# Patient Record
Sex: Male | Born: 1965 | Race: Asian | Hispanic: No | Marital: Married | Smoking: Former smoker
Health system: Southern US, Community
[De-identification: ages and names within clinical notes are randomized; demographics above are authoritative.]

## PROBLEM LIST (undated history)

## (undated) ENCOUNTER — Emergency Department (HOSPITAL_COMMUNITY): Disposition: A | Discharge status: Home / Self Care | Payer: Self-pay

## (undated) DIAGNOSIS — T8859XA Other complications of anesthesia, initial encounter: Secondary | ICD-10-CM

## (undated) DIAGNOSIS — Z87442 Personal history of urinary calculi: Secondary | ICD-10-CM

## (undated) DIAGNOSIS — N201 Calculus of ureter: Secondary | ICD-10-CM

## (undated) DIAGNOSIS — T4145XA Adverse effect of unspecified anesthetic, initial encounter: Secondary | ICD-10-CM

## (undated) DIAGNOSIS — N189 Chronic kidney disease, unspecified: Secondary | ICD-10-CM

## (undated) HISTORY — PX: CATARACT EXTRACTION W/ INTRAOCULAR LENS IMPLANT: SHX1309

---

## 2002-03-07 ENCOUNTER — Emergency Department (HOSPITAL_COMMUNITY): Admission: EM | Admit: 2002-03-07 | Discharge: 2002-03-07 | Payer: Self-pay | Admitting: Emergency Medicine

## 2009-02-23 ENCOUNTER — Emergency Department: Payer: Self-pay | Admitting: Emergency Medicine

## 2009-02-28 HISTORY — PX: OTHER SURGICAL HISTORY: SHX169

## 2009-06-24 ENCOUNTER — Ambulatory Visit: Payer: Self-pay | Admitting: Ophthalmology

## 2012-06-19 ENCOUNTER — Ambulatory Visit: Payer: Self-pay | Admitting: Urology

## 2012-06-20 ENCOUNTER — Other Ambulatory Visit: Payer: Self-pay | Admitting: Urology

## 2012-06-21 ENCOUNTER — Ambulatory Visit: Payer: Self-pay | Admitting: Urology

## 2012-06-25 ENCOUNTER — Encounter (HOSPITAL_COMMUNITY): Payer: Self-pay | Admitting: Pharmacy Technician

## 2012-06-27 ENCOUNTER — Encounter (HOSPITAL_COMMUNITY): Payer: Self-pay | Admitting: *Deleted

## 2012-07-05 ENCOUNTER — Encounter (HOSPITAL_COMMUNITY): Payer: Self-pay | Admitting: *Deleted

## 2012-07-05 ENCOUNTER — Ambulatory Visit (HOSPITAL_COMMUNITY): Payer: Managed Care, Other (non HMO)

## 2012-07-05 ENCOUNTER — Ambulatory Visit (HOSPITAL_COMMUNITY)
Admission: RE | Admit: 2012-07-05 | Discharge: 2012-07-05 | Disposition: A | Discharge status: Home / Self Care | Payer: Managed Care, Other (non HMO) | Source: Ambulatory Visit | Attending: Urology | Admitting: Urology

## 2012-07-05 ENCOUNTER — Encounter (HOSPITAL_COMMUNITY)
Admission: RE | Disposition: A | Discharge status: Home / Self Care | Payer: Self-pay | Source: Ambulatory Visit | Attending: Urology

## 2012-07-05 ENCOUNTER — Emergency Department: Payer: Self-pay | Admitting: Emergency Medicine

## 2012-07-05 DIAGNOSIS — R109 Unspecified abdominal pain: Secondary | ICD-10-CM | POA: Insufficient documentation

## 2012-07-05 DIAGNOSIS — R112 Nausea with vomiting, unspecified: Secondary | ICD-10-CM | POA: Insufficient documentation

## 2012-07-05 DIAGNOSIS — N1330 Unspecified hydronephrosis: Secondary | ICD-10-CM | POA: Insufficient documentation

## 2012-07-05 DIAGNOSIS — N201 Calculus of ureter: Secondary | ICD-10-CM | POA: Insufficient documentation

## 2012-07-05 DIAGNOSIS — N200 Calculus of kidney: Secondary | ICD-10-CM | POA: Insufficient documentation

## 2012-07-05 HISTORY — PX: EXTRACORPOREAL SHOCK WAVE LITHOTRIPSY: SHX1557

## 2012-07-05 HISTORY — DX: Chronic kidney disease, unspecified: N18.9

## 2012-07-05 LAB — COMPREHENSIVE METABOLIC PANEL
Alkaline Phosphatase: 78 U/L (ref 50–136)
Calcium, Total: 9 mg/dL (ref 8.5–10.1)
Chloride: 104 mmol/L (ref 98–107)
EGFR (Non-African Amer.): >60
Osmolality: 279 (ref 275–301)
Potassium: 3.8 mmol/L (ref 3.5–5.1)
Total Protein: 8.1 g/dL (ref 6.4–8.2)

## 2012-07-05 LAB — CBC WITH DIFFERENTIAL/PLATELET
Basophil #: 0 10*3/uL (ref 0.0–0.1)
Eosinophil #: 0.1 10*3/uL (ref 0.0–0.7)
Eosinophil %: 0.9 %
HCT: 43.3 % (ref 40.0–52.0)
Lymphocyte #: 2 10*3/uL (ref 1.0–3.6)
Lymphocyte %: 14.9 %
MCHC: 34.4 g/dL (ref 32.0–36.0)
MCV: 91 fL (ref 80–100)
Monocyte %: 7.1 %
Neutrophil #: 10 10*3/uL — ABNORMAL HIGH (ref 1.4–6.5)
Platelet: 256 10*3/uL (ref 150–440)
RBC: 4.77 10*6/uL (ref 4.40–5.90)
WBC: 13.1 10*3/uL — ABNORMAL HIGH (ref 3.8–10.6)

## 2012-07-05 SURGERY — LITHOTRIPSY, ESWL
Anesthesia: LOCAL | Laterality: Right

## 2012-07-05 MED ORDER — SODIUM CHLORIDE 0.9 % IV SOLN
INTRAVENOUS | Status: DC
  Administered 2012-07-05: 09:00:00 via INTRAVENOUS

## 2012-07-05 MED ORDER — CIPROFLOXACIN HCL 500 MG PO TABS: 500.0000 mg | ORAL_TABLET | Freq: Two times a day (BID) | ORAL | Status: DC

## 2012-07-05 MED ORDER — OXYCODONE-ACETAMINOPHEN 5-325 MG PO TABS
1.0000 | ORAL_TABLET | ORAL | Status: DC | PRN
  Administered 2012-07-05: 1 via ORAL
  Filled 2012-07-05: qty 1

## 2012-07-05 MED ORDER — DIAZEPAM 5 MG PO TABS
10.0000 mg | ORAL_TABLET | ORAL | Status: AC
  Administered 2012-07-05: 10 mg via ORAL
  Filled 2012-07-05: qty 2

## 2012-07-05 MED ORDER — DIPHENHYDRAMINE HCL 25 MG PO CAPS
25.0000 mg | ORAL_CAPSULE | ORAL | Status: AC
  Administered 2012-07-05: 25 mg via ORAL
  Filled 2012-07-05: qty 1

## 2012-07-05 MED ORDER — CIPROFLOXACIN HCL 500 MG PO TABS
500.0000 mg | ORAL_TABLET | ORAL | Status: AC
  Administered 2012-07-05: 500 mg via ORAL
  Filled 2012-07-05: qty 1

## 2012-07-05 MED ORDER — OXYCODONE-ACETAMINOPHEN 5-325 MG PO TABS: 1.0000 | ORAL_TABLET | ORAL | Status: DC | PRN

## 2012-07-05 NOTE — H&P (Signed)
  H&P  History of Present Illness: the patient presented with intermittent right flank pain April 2014. KUB showed a calcification of 1.3 cm in the region of the right UPJ. Renal ultrasound was equivocal and a CT scan confirmed stone in the right renal pelvis. Looking at today's KUB the stone appears stable in the right renal pelvis.  The patient has been well without dysuria or fever.  Past Medical History  Diagnosis Date  . Chronic kidney disease     hx of right kidney stone   Past Surgical History  Procedure Laterality Date  . Eye surgery      3-4 years ago    Home Medications:  Prescriptions prior to admission  Medication Sig Dispense Refill  . oxyCODONE-acetaminophen (PERCOCET/ROXICET) 5-325 MG per tablet Take 1 tablet by mouth every 4 (four) hours as needed for pain.      . EPINEPHrine (EPIPEN 2-PAK) 0.3 mg/0.3 mL DEVI Inject 0.3 mg into the muscle once. For bee stings       Allergies:  Allergies  Allergen Reactions  . Bee Venom Anaphylaxis    History reviewed. No pertinent family history. Social History:  reports that he has quit smoking. He does not have any smokeless tobacco history on file. He reports that  drinks alcohol. He reports that he uses illicit drugs (Marijuana).  ROS: A complete review of systems was performed.  All systems are negative except for pertinent findings as noted. @ROS@   Physical Exam:  Vital signs in last 24 hours: Temp:  [98 F (36.7 C)] 98 F (36.7 C) (05/08 0815) Pulse Rate:  [69] 69 (05/08 0815) Resp:  [18] 18 (05/08 0815) BP: (130)/(78) 130/78 mmHg (05/08 0815) SpO2:  [99 %] 99 % (05/08 0815) Weight:  [107.729 kg (237 lb 8 oz)] 107.729 kg (237 lb 8 oz) (05/08 0815) General:  Alert and oriented, No acute distress HEENT: Normocephalic, atraumatic Neck: No JVD or lymphadenopathy Cardiovascular: Regular rate and rhythm Lungs: Regular rate and effort Abdomen: Soft, nontender, nondistended, no abdominal masses Back: No CVA  tenderness Extremities: No edema Neurologic: Grossly intact  Laboratory Data:  No results found for this or any previous visit (from the past 24 hour(s)). No results found for this or any previous visit (from the past 240 hour(s)). Creatinine: No results found for this basename: CREATININE,  in the last 168 hours  Impression/Assessment:   right renal stone with intermittent renal colic  Plan:  I discussed with the patient the nature, potential benefits, risks and alternatives to right extracorporeal shockwave lithotripsy, including side effects of the proposed treatment, the likelihood of the patient achieving the goals of the procedure, and any potential problems that might occur during the procedure or recuperation. All questions answered. Patient elects to proceed.  [name] [name] 07/05/2012, 10:06 AM  

## 2012-07-05 NOTE — Brief Op Note (Signed)
07/05/2012  11:17 AM  PATIENT:  [name] [name]  47 y.o. male  PRE-OPERATIVE DIAGNOSIS:  RIGHT NEPHROLITHASIS   POST-OPERATIVE DIAGNOSIS:  * No post-op diagnosis entered *  PROCEDURE:  Procedure(s): EXTRACORPOREAL SHOCK WAVE LITHOTRIPSY (ESWL)RIGHT   (Right)  SURGEON:  Surgeon(s) and Role:    * Antony Haste, MD - Primary   Findings: treatment tolerated poorly - pt with pain at 0.8, movement, coughing, required recoupling.   PATIENT DISPOSITION:  PACU - hemodynamically stable.   Delay start of Pharmacological VTE agent (>24hrs) due to surgical blood loss or risk of bleeding: yes

## 2012-07-06 ENCOUNTER — Encounter (HOSPITAL_COMMUNITY)
Admission: AD | Disposition: A | Discharge status: Home / Self Care | Payer: Self-pay | Source: Other Acute Inpatient Hospital | Attending: Urology

## 2012-07-06 ENCOUNTER — Encounter (HOSPITAL_COMMUNITY): Payer: Self-pay | Admitting: Certified Registered"

## 2012-07-06 ENCOUNTER — Observation Stay (HOSPITAL_COMMUNITY)
Admission: AD | Admit: 2012-07-06 | Discharge: 2012-07-06 | Disposition: A | Discharge status: Home / Self Care | Payer: Managed Care, Other (non HMO) | Source: Other Acute Inpatient Hospital | Attending: Urology | Admitting: Urology

## 2012-07-06 ENCOUNTER — Observation Stay (HOSPITAL_COMMUNITY): Payer: Managed Care, Other (non HMO) | Admitting: Certified Registered"

## 2012-07-06 HISTORY — PX: CYSTOSCOPY W/ URETERAL STENT PLACEMENT: SHX1429

## 2012-07-06 LAB — URINALYSIS, COMPLETE
Bilirubin,UR: NEGATIVE
Ketone: NEGATIVE
Nitrite: NEGATIVE
Protein: 30
RBC,UR: 175 /HPF (ref 0–5)
WBC UR: 1 /HPF (ref 0–5)

## 2012-07-06 SURGERY — CYSTOSCOPY, WITH RETROGRADE PYELOGRAM AND URETERAL STENT INSERTION
Anesthesia: General | Laterality: Right

## 2012-07-06 SURGERY — CYSTOSCOPY, WITH RETROGRADE PYELOGRAM AND URETERAL STENT INSERTION
Anesthesia: General | Site: Ureter | Laterality: Right | Wound class: Clean Contaminated

## 2012-07-06 MED ORDER — ONDANSETRON HCL 4 MG/2ML IJ SOLN: 4.0000 mg | INTRAMUSCULAR | Status: DC | PRN

## 2012-07-06 MED ORDER — IOHEXOL 300 MG/ML  SOLN
INTRAMUSCULAR | Status: DC | PRN
  Administered 2012-07-06: 50 mL

## 2012-07-06 MED ORDER — SODIUM CHLORIDE 0.9 % IR SOLN
Status: DC | PRN
  Administered 2012-07-06: 1000 mL

## 2012-07-06 MED ORDER — ONDANSETRON HCL 4 MG/2ML IJ SOLN
INTRAMUSCULAR | Status: DC | PRN
  Administered 2012-07-06: 4 mg via INTRAVENOUS

## 2012-07-06 MED ORDER — HYDROMORPHONE HCL PF 1 MG/ML IJ SOLN
0.5000 mg | INTRAMUSCULAR | Status: DC | PRN
  Administered 2012-07-06 (×2): 1 mg via INTRAVENOUS
  Filled 2012-07-06 (×2): qty 1

## 2012-07-06 MED ORDER — MEPERIDINE HCL 50 MG/ML IJ SOLN: 6.2500 mg | INTRAMUSCULAR | Status: DC | PRN

## 2012-07-06 MED ORDER — PROMETHAZINE HCL 25 MG/ML IJ SOLN: 6.2500 mg | INTRAMUSCULAR | Status: DC | PRN

## 2012-07-06 MED ORDER — OXYCODONE HCL 5 MG PO TABS: 5.0000 mg | ORAL_TABLET | Freq: Once | ORAL | Status: DC | PRN

## 2012-07-06 MED ORDER — OXYCODONE-ACETAMINOPHEN 5-325 MG PO TABS: 1.0000 | ORAL_TABLET | ORAL | Status: DC | PRN

## 2012-07-06 MED ORDER — STERILE WATER FOR IRRIGATION IR SOLN
Status: DC | PRN
  Administered 2012-07-06: 1000 mL

## 2012-07-06 MED ORDER — SUCCINYLCHOLINE CHLORIDE 20 MG/ML IJ SOLN
INTRAMUSCULAR | Status: DC | PRN
  Administered 2012-07-06: 100 mg via INTRAVENOUS

## 2012-07-06 MED ORDER — ACETAMINOPHEN 10 MG/ML IV SOLN
INTRAVENOUS | Status: DC | PRN
  Administered 2012-07-06: 1000 mg via INTRAVENOUS

## 2012-07-06 MED ORDER — CEFAZOLIN SODIUM-DEXTROSE 2-3 GM-% IV SOLR
INTRAVENOUS | Status: AC
  Filled 2012-07-06: qty 50

## 2012-07-06 MED ORDER — HYDROMORPHONE HCL PF 1 MG/ML IJ SOLN: 0.2500 mg | INTRAMUSCULAR | Status: DC | PRN

## 2012-07-06 MED ORDER — CEFAZOLIN SODIUM-DEXTROSE 2-3 GM-% IV SOLR
INTRAVENOUS | Status: DC | PRN
  Administered 2012-07-06: 2 g via INTRAVENOUS

## 2012-07-06 MED ORDER — IOHEXOL 300 MG/ML  SOLN
INTRAMUSCULAR | Status: AC
  Filled 2012-07-06: qty 1

## 2012-07-06 MED ORDER — DEXTROSE 5 % IV SOLN
1.0000 g | INTRAVENOUS | Status: DC
  Filled 2012-07-06 (×2): qty 10

## 2012-07-06 MED ORDER — SODIUM CHLORIDE 0.9 % IV SOLN
INTRAVENOUS | Status: DC
  Administered 2012-07-06: 02:00:00 via INTRAVENOUS

## 2012-07-06 MED ORDER — DOCUSATE SODIUM 100 MG PO CAPS
100.0000 mg | ORAL_CAPSULE | Freq: Two times a day (BID) | ORAL | Status: DC
  Administered 2012-07-06: 100 mg via ORAL
  Filled 2012-07-06 (×2): qty 1

## 2012-07-06 MED ORDER — ACETAMINOPHEN 10 MG/ML IV SOLN
INTRAVENOUS | Status: AC
  Filled 2012-07-06: qty 100

## 2012-07-06 MED ORDER — CIPROFLOXACIN HCL 500 MG PO TABS
500.0000 mg | ORAL_TABLET | Freq: Two times a day (BID) | ORAL | Status: DC
  Administered 2012-07-06: 500 mg via ORAL
  Filled 2012-07-06 (×3): qty 1

## 2012-07-06 MED ORDER — LIDOCAINE HCL (CARDIAC) 20 MG/ML IV SOLN
INTRAVENOUS | Status: DC | PRN
  Administered 2012-07-06: 100 mg via INTRAVENOUS

## 2012-07-06 MED ORDER — OXYCODONE HCL 5 MG/5ML PO SOLN: 5.0000 mg | Freq: Once | ORAL | Status: DC | PRN

## 2012-07-06 MED ORDER — ACETAMINOPHEN 10 MG/ML IV SOLN
1000.0000 mg | Freq: Once | INTRAVENOUS | Status: DC | PRN
  Filled 2012-07-06: qty 100

## 2012-07-06 MED ORDER — PROPOFOL 10 MG/ML IV BOLUS
INTRAVENOUS | Status: DC | PRN
  Administered 2012-07-06: 200 mg via INTRAVENOUS

## 2012-07-06 MED ORDER — SODIUM CHLORIDE 0.9 % IV SOLN: INTRAVENOUS | Status: DC

## 2012-07-06 SURGICAL SUPPLY — 16 items
ADAPTER CATH URET PLST 4-6FR (CATHETERS) ×3 IMPLANT
ADPR CATH URET STRL DISP 4-6FR (CATHETERS) ×2
BAG URO CATCHER STRL LF (DRAPE) ×3 IMPLANT
CATH URET 5FR 28IN CONE TIP (BALLOONS)
CATH URET 5FR 70CM CONE TIP (BALLOONS) ×1 IMPLANT
CLOTH BEACON ORANGE TIMEOUT ST (SAFETY) ×3 IMPLANT
DRAPE CAMERA CLOSED 9X96 (DRAPES) ×3 IMPLANT
GLOVE BIOGEL M 8.0 STRL (GLOVE) ×3 IMPLANT
GOWN PREVENTION PLUS XLARGE (GOWN DISPOSABLE) ×3 IMPLANT
GOWN STRL REIN XL XLG (GOWN DISPOSABLE) ×3 IMPLANT
GUIDEWIRE STR DUAL SENSOR (WIRE) ×3 IMPLANT
MANIFOLD NEPTUNE II (INSTRUMENTS) ×3 IMPLANT
PACK CYSTO (CUSTOM PROCEDURE TRAY) ×3 IMPLANT
STENT CONTOUR 6FRX28X.038 (STENTS) ×2 IMPLANT
TUBING CONNECTING 10 (TUBING) ×3 IMPLANT
WIRE COONS/BENSON .038X145CM (WIRE) ×1 IMPLANT

## 2012-07-06 NOTE — H&P (Addendum)
  H&P   History of Present Illness:   Pt s/p right ESWL for 14 mm right UPJ stone yesterday. Since then he has had severe right flank pain, nausea and emesis.   CT A/P at [facility] - stone fragmented weel - several fragements have passed to right UVJ with proximal obstruction.   Past Medical History  Diagnosis Date  . Chronic kidney disease     hx of right kidney stone   Past Surgical History  Procedure Laterality Date  . Eye surgery      3-4 years ago    Home Medications:  Prescriptions prior to admission  Medication Sig Dispense Refill  . ciprofloxacin (CIPRO) 500 MG tablet Take 1 tablet (500 mg total) by mouth 2 (two) times daily.  6 tablet  0  . oxyCODONE-acetaminophen (PERCOCET/ROXICET) 5-325 MG per tablet Take 1 tablet by mouth every 4 (four) hours as needed for pain.      Marland Kitchen oxyCODONE-acetaminophen (ROXICET) 5-325 MG per tablet Take 1 tablet by mouth every 4 (four) hours as needed for pain.  30 tablet  0  . EPINEPHrine (EPIPEN 2-PAK) 0.3 mg/0.3 mL DEVI Inject 0.3 mg into the muscle once. For bee stings       Allergies:  Allergies  Allergen Reactions  . Bee Venom Anaphylaxis    No family history on file. Social History:  reports that he has quit smoking. He does not have any smokeless tobacco history on file. He reports that  drinks alcohol. He reports that he uses illicit drugs (Marijuana).  ROS: A complete review of systems was performed.  All systems are negative except for pertinent findings as noted. @ROS @   Physical Exam:  Vital signs in last 24 hours: Temp:  [97.9 F (36.6 C)-99 F (37.2 C)] 99 F (37.2 C) (05/09 0053) Pulse Rate:  [67-77] 77 (05/09 0053) Resp:  [18-20] 20 (05/09 0053) BP: (123-143)/(76-85) 143/76 mmHg (05/09 0053) SpO2:  [94 %-99 %] 94 % (05/09 0053) Weight:  [107.729 kg (237 lb 8 oz)] 107.729 kg (237 lb 8 oz) (05/08 0815) General:  Alert and oriented, No acute distress HEENT: Normocephalic, atraumatic Neck: No JVD or  lymphadenopathy Cardiovascular: Regular rate and rhythm Lungs: Regular rate and effort Abdomen: Soft, nontender, nondistended, no abdominal masses GU: foley in place with yellow urine in bag. Back: No CVA tenderness Extremities: No edema Neurologic: Grossly intact  Laboratory Data:  From [facility]: Wbc 13.1 Hgb 14.9 Hct 43 plt 256 Na  K Bun 16 Cr 1.3  Impression/Assessment:  Right renal stones Right ureteral stones Right hydronephrosis Flank pain Nausea/emesis  Plan:  I discussed with the patient the nature, potential benefits, risks and alternatives to cystoscopy, right RGP and right ureteral stent placement, including side effects of the proposed treatment, the likelihood of the patient achieving the goals of the procedure, and any potential problems that might occur during the procedure or recuperation. We discussed I would prefer to stent him tonight and get him stabilized with f/u in office with KUB to consider stent removal vs. Second look ureteroscopy in a staged fashion. I'm a bit hesitant to do ureteroscopy tonight with T 99 and WBC 13. All questions answered. Patient elects to proceed with cystoscopy, stent placement.   Antony Haste 07/06/2012, 1:44 AM   I should add I reviewed the CT images, [facility] notes and labs myself.

## 2012-07-06 NOTE — Transfer of Care (Signed)
Immediate Anesthesia Transfer of Care Note  Patient: [name] [name]  Procedure(s) Performed: Procedure(s): CYSTOSCOPY WITH RETROGRADE PYELOGRAM/URETERAL STENT PLACEMENT right (Right)  Patient Location: PACU  Anesthesia Type:General  Level of Consciousness: awake, alert  and oriented  Airway & Oxygen Therapy: Patient Spontanous Breathing and Patient connected to face mask oxygen  Post-op Assessment: Report given to PACU RN and Post -op Vital signs reviewed and stable  Post vital signs: Reviewed and stable  Complications: No apparent anesthesia complications

## 2012-07-06 NOTE — Progress Notes (Addendum)
Report called from [facility] ED Nurse Gaylyn Rong: regarding transporting patient to [facility] on the service or Doctor Eskridge.  Patient transported by carelink. Vitals signs taken, patient placed on monitor and central telemetry tech (Darlinda) called and confirmed medical record information and strip provided. Call back from Doctor Eskridge orders given patient to remain NPO. IV fluids started per order. Patient with complaint of pain on R flank area. Foley noted with blood tinged urine. Will continue to monitor per Doctor's orders and unit protocol.  Consent obtained.  Patient transported to the OR on telemetry. Central monitoring called (Darlinda) to inform of transfer. Charge Nurse Maxine Glenn made aware, patient may possibly return to rm [id number].   Patient's spouse name and # [name] [name] [phone]  Update: patient has voided this morning, clear amber urine (see doc flow sheets ). Urinal  on bathroom shelf. Ongoing nurse aware and  Witnessed.

## 2012-07-06 NOTE — OR Nursing (Signed)
Corrected doctor arrival and procedure start times, Jannett Celestine, RN

## 2012-07-06 NOTE — Progress Notes (Signed)
Discharge to home, wife at bedside, d/c instructions and follow up appoinmentt done and was given to the patient. PIV removed no s/s of  Infiltration or swelling noted upon discharged.No complaints of any pain or discomfort upon discharged.

## 2012-07-06 NOTE — Op Note (Signed)
Preoperative diagnosis: Right ureteral stone, right renal stones, right hydronephrosis, emesis, flank pain Postop diagnosis: Same  Procedure: Exam under anesthesia Cystoscopy Right retrograde pyelogram Right ureteral stent placement  Surgeon: Ashlon Lottman Type of anesthesia: Gen.  Findings: On exam under anesthesia the penis was without mass or lesion. Testicles were descended and palpably normal. The prostate was mildly enlarged but palpably normal with no hard area or nodule.  Cystoscopy-the urethra appeared normal, the prostatic urethra appeared normal, the trigone and ureteral orifice these were in their normal orthotopic position, proximal to the right ureteral orifice there was a bulge in the mucosa likely from stones underneath, the bladder mucosa was normal without mass or lesion. There were multiple stone fragments in the bladder.  Right retrograde pyelogram  the right collecting system appeared mildly dilated with multiple filling defects in the renal pelvis consistent with stone.  Description of procedure: After consent was obtained patient brought to the operating room. After adequate anesthesia he was placed in lithotomy position. A timeout was performed to confirm the patient and procedure. An exam under anesthesia was performed. The patient was prepped and draped in the usual fashion. A cystoscope was passed per urethra and I attempted to advance a 6 [place] open-ended catheter into the right ureteral orifice for retrograde the ureteral orifice was very narrowed. Next I attempted to advance a sensor wire into the right ureter. The wire would go a few millimeters through the ureteral orifice and then bow back into the bladder. I had the brace the sensor wire with a 6 [place] open-ended catheter and very carefully advanced the sensor wire. It took several tries to get proximal to the stone fragments with the wire. The wire was then advanced up into the right collecting system and the  open-ended catheter was advanced into the right collecting system. The wire was removed and retrograde injection of contrast was performed which confirm proper placement of the wire and the collecting system. The wire was re\re coiled in the open-ended catheter removed. Over the wire 6 x 28 cm stent was advanced and the wire removed. Good coil seen in the bladder and a good coil in the kidney. The bladder was drained and the scope removed. The patient was awakened and brought to the recovery room in stable condition.  Complications: None  Blood loss: None  Drains: 6 x 28 cm ureteral stent  Disposition: Patient stable to PACU

## 2012-07-06 NOTE — Anesthesia Preprocedure Evaluation (Addendum)
Anesthesia Evaluation  Patient identified by MRN, date of birth, ID band Patient awake    Reviewed: Allergy & Precautions, H&P , NPO status , Patient's Chart, lab work & pertinent test results  Airway Mallampati: II TM Distance: >3 FB Neck ROM: Full    Dental  (+) Dental Advisory Given and Teeth Intact   Pulmonary neg pulmonary ROS,  breath sounds clear to auscultation        Cardiovascular negative cardio ROS  Rhythm:Regular Rate:Normal     Neuro/Psych negative neurological ROS  negative psych ROS   GI/Hepatic negative GI ROS, Neg liver ROS,   Endo/Other  negative endocrine ROS  Renal/GU negative Renal ROS     Musculoskeletal negative musculoskeletal ROS (+)   Abdominal   Peds  Hematology negative hematology ROS (+)   Anesthesia Other Findings   Reproductive/Obstetrics                         Anesthesia Physical Anesthesia Plan  ASA: II  Anesthesia Plan: General   Post-op Pain Management:    Induction: Intravenous and Rapid sequence  Airway Management Planned: Oral ETT  Additional Equipment:   Intra-op Plan:   Post-operative Plan: Extubation in OR  Informed Consent: I have reviewed the patients History and Physical, chart, labs and discussed the procedure including the risks, benefits and alternatives for the proposed anesthesia with the patient or authorized representative who has indicated his/her understanding and acceptance.   Dental advisory given  Plan Discussed with: CRNA  Anesthesia Plan Comments:         Anesthesia Quick Evaluation

## 2012-07-06 NOTE — Discharge Summary (Addendum)
Physician Discharge Summary  Patient ID: [name] [name] MRN: [id number] DOB/AGE: 09/09/1965 47 y.o.  Admit date: 07/06/2012 Discharge date: 07/06/2012  Admission Diagnoses: Right renal stone Right ureteral stone Emesis Flank pain Hydronephrosis Discharge Diagnoses:  Active Problems:   * No active hospital problems. * Same   Discharged Condition: good  Hospital Course: Patient admitted and underwent cysto, right ureteral stent placement. He has done well. Pain improved, emesis resolved. AFVSS. Tolerating regular diet.  Consults: None  Significant Diagnostic Studies:none  Treatments: surgery:  cystoscopy, right ureteral stent placement  Discharge Exam: Blood pressure 130/77, pulse 80, temperature 98.5 F (36.9 C), temperature source Oral, resp. rate 12, height 6\' 2"  (1.88 m), weight 107.7 kg (237 lb 7 oz), SpO2 98.00%. A&Ox3 NAD Abd - soft, NT, ND. No flank pain.  Disposition: 01-Home or Self Care     Medication List    TAKE these medications       ciprofloxacin 500 MG tablet  Commonly known as:  CIPRO  Take 1 tablet (500 mg total) by mouth 2 (two) times daily.     EPIPEN 2-PAK 0.3 mg/0.3 mL Devi  Generic drug:  EPINEPHrine  Inject 0.3 mg into the muscle once. For bee stings     oxyCODONE-acetaminophen 5-325 MG per tablet  Commonly known as:  ROXICET  Take 1 tablet by mouth every 4 (four) hours as needed for pain.           Follow-up Information   Follow up with Mena Goes Lowella Petties, MD In 2 weeks.   Contact information:   [address] [place] [address] [phone]       Signed: Antony Haste 07/06/2012, 11:48 AM

## 2012-07-09 ENCOUNTER — Encounter (HOSPITAL_COMMUNITY): Payer: Self-pay | Admitting: Urology

## 2012-07-09 NOTE — Anesthesia Postprocedure Evaluation (Signed)
Anesthesia Post Note  Patient: [name] [name]  Procedure(s) Performed: Procedure(s) (LRB): CYSTOSCOPY WITH RETROGRADE PYELOGRAM/URETERAL STENT PLACEMENT right (Right)  Anesthesia type: General  Patient location: PACU  Post pain: Pain level controlled  Post assessment: Post-op Vital signs reviewed  Last Vitals: BP 130/77  Pulse 80  Temp(Src) 36.9 C (Oral)  Resp 12  Ht 6\' 2"  (1.88 m)  Wt 237 lb 7 oz (107.7 kg)  BMI 30.47 kg/m2  SpO2 98%  Post vital signs: Reviewed  Level of consciousness: sedated  Complications: No apparent anesthesia complications

## 2012-07-17 ENCOUNTER — Ambulatory Visit: Payer: Self-pay | Admitting: Urology

## 2012-07-20 ENCOUNTER — Other Ambulatory Visit: Payer: Self-pay | Admitting: Urology

## 2012-07-20 ENCOUNTER — Encounter (HOSPITAL_BASED_OUTPATIENT_CLINIC_OR_DEPARTMENT_OTHER): Payer: Self-pay | Admitting: *Deleted

## 2012-07-20 NOTE — Progress Notes (Signed)
NPO AFTER MN. ARRIVES AT 0845. NEEDS HG. MAY TAKE OXYCODONE IF NEEDED W/ SIP OF WATER.

## 2012-07-24 ENCOUNTER — Encounter (HOSPITAL_BASED_OUTPATIENT_CLINIC_OR_DEPARTMENT_OTHER): Payer: Self-pay | Admitting: *Deleted

## 2012-07-24 ENCOUNTER — Encounter (HOSPITAL_BASED_OUTPATIENT_CLINIC_OR_DEPARTMENT_OTHER)
Admission: RE | Disposition: A | Discharge status: Home / Self Care | Payer: Self-pay | Source: Ambulatory Visit | Attending: Urology

## 2012-07-24 ENCOUNTER — Ambulatory Visit (HOSPITAL_BASED_OUTPATIENT_CLINIC_OR_DEPARTMENT_OTHER)
Admission: RE | Admit: 2012-07-24 | Discharge: 2012-07-24 | Disposition: A | Discharge status: Home / Self Care | Payer: Managed Care, Other (non HMO) | Source: Ambulatory Visit | Attending: Urology | Admitting: Urology

## 2012-07-24 ENCOUNTER — Ambulatory Visit (HOSPITAL_BASED_OUTPATIENT_CLINIC_OR_DEPARTMENT_OTHER): Payer: Managed Care, Other (non HMO) | Admitting: Anesthesiology

## 2012-07-24 ENCOUNTER — Encounter (HOSPITAL_BASED_OUTPATIENT_CLINIC_OR_DEPARTMENT_OTHER): Payer: Self-pay | Admitting: Anesthesiology

## 2012-07-24 DIAGNOSIS — N201 Calculus of ureter: Secondary | ICD-10-CM | POA: Insufficient documentation

## 2012-07-24 DIAGNOSIS — N200 Calculus of kidney: Secondary | ICD-10-CM | POA: Insufficient documentation

## 2012-07-24 HISTORY — DX: Personal history of urinary calculi: Z87.442

## 2012-07-24 HISTORY — PX: CYSTOSCOPY WITH STENT PLACEMENT: SHX5790

## 2012-07-24 HISTORY — PX: HOLMIUM LASER APPLICATION: SHX5852

## 2012-07-24 HISTORY — PX: CYSTOSCOPY W/ URETERAL STENT REMOVAL: SHX1430

## 2012-07-24 HISTORY — DX: Calculus of ureter: N20.1

## 2012-07-24 SURGERY — CYSTOURETEROSCOPY, WITH STENT INSERTION
Anesthesia: General | Site: Ureter | Laterality: Right

## 2012-07-24 MED ORDER — DEXAMETHASONE SODIUM PHOSPHATE 4 MG/ML IJ SOLN
INTRAMUSCULAR | Status: DC | PRN
  Administered 2012-07-24: 8 mg via INTRAVENOUS

## 2012-07-24 MED ORDER — CEFAZOLIN SODIUM-DEXTROSE 2-3 GM-% IV SOLR
2.0000 g | INTRAVENOUS | Status: AC
  Administered 2012-07-24: 2 g via INTRAVENOUS
  Filled 2012-07-24: qty 50

## 2012-07-24 MED ORDER — LIDOCAINE HCL (CARDIAC) 20 MG/ML IV SOLN
INTRAVENOUS | Status: DC | PRN
  Administered 2012-07-24: 60 mg via INTRAVENOUS

## 2012-07-24 MED ORDER — FENTANYL CITRATE 0.05 MG/ML IJ SOLN
INTRAMUSCULAR | Status: DC | PRN
  Administered 2012-07-24: 100 ug via INTRAVENOUS
  Administered 2012-07-24 (×2): 25 ug via INTRAVENOUS

## 2012-07-24 MED ORDER — LACTATED RINGERS IV SOLN
INTRAVENOUS | Status: DC
  Administered 2012-07-24 (×2): via INTRAVENOUS
  Administered 2012-07-24: 100 mL/h via INTRAVENOUS
  Filled 2012-07-24: qty 1000

## 2012-07-24 MED ORDER — KETOROLAC TROMETHAMINE 30 MG/ML IJ SOLN
30.0000 mg | Freq: Four times a day (QID) | INTRAMUSCULAR | Status: DC | PRN
  Administered 2012-07-24: 30 mg via INTRAVENOUS
  Filled 2012-07-24: qty 1

## 2012-07-24 MED ORDER — MEPERIDINE HCL 25 MG/ML IJ SOLN
6.2500 mg | INTRAMUSCULAR | Status: DC | PRN
  Filled 2012-07-24: qty 1

## 2012-07-24 MED ORDER — SODIUM CHLORIDE 0.9 % IR SOLN
Status: DC | PRN
  Administered 2012-07-24: 6000 mL

## 2012-07-24 MED ORDER — CEFAZOLIN SODIUM 1-5 GM-% IV SOLN
1.0000 g | INTRAVENOUS | Status: DC
  Filled 2012-07-24: qty 50

## 2012-07-24 MED ORDER — MIDAZOLAM HCL 5 MG/5ML IJ SOLN
INTRAMUSCULAR | Status: DC | PRN
  Administered 2012-07-24: 2 mg via INTRAVENOUS

## 2012-07-24 MED ORDER — OXYCODONE-ACETAMINOPHEN 5-325 MG PO TABS
1.0000 | ORAL_TABLET | Freq: Once | ORAL | Status: AC
  Administered 2012-07-24: 1 via ORAL
  Filled 2012-07-24: qty 1

## 2012-07-24 MED ORDER — ESMOLOL HCL 10 MG/ML IV SOLN
INTRAVENOUS | Status: DC | PRN
  Administered 2012-07-24: 30 mg via INTRAVENOUS

## 2012-07-24 MED ORDER — FENTANYL CITRATE 0.05 MG/ML IJ SOLN
25.0000 ug | INTRAMUSCULAR | Status: DC | PRN
  Administered 2012-07-24 (×2): 25 ug via INTRAVENOUS
  Administered 2012-07-24: 50 ug via INTRAVENOUS
  Administered 2012-07-24 (×2): 25 ug via INTRAVENOUS
  Filled 2012-07-24: qty 1

## 2012-07-24 MED ORDER — ONDANSETRON HCL 4 MG/2ML IJ SOLN
INTRAMUSCULAR | Status: DC | PRN
  Administered 2012-07-24: 4 mg via INTRAVENOUS

## 2012-07-24 MED ORDER — LABETALOL HCL 5 MG/ML IV SOLN
5.0000 mg | INTRAVENOUS | Status: DC | PRN
  Administered 2012-07-24 (×4): 5 mg via INTRAVENOUS
  Filled 2012-07-24: qty 4

## 2012-07-24 MED ORDER — LACTATED RINGERS IV SOLN
INTRAVENOUS | Status: DC
  Filled 2012-07-24: qty 1000

## 2012-07-24 MED ORDER — PROPOFOL 10 MG/ML IV BOLUS
INTRAVENOUS | Status: DC | PRN
  Administered 2012-07-24: 200 mg via INTRAVENOUS

## 2012-07-24 MED ORDER — PROMETHAZINE HCL 25 MG/ML IJ SOLN
6.2500 mg | INTRAMUSCULAR | Status: DC | PRN
  Filled 2012-07-24: qty 1

## 2012-07-24 MED ORDER — BELLADONNA ALKALOIDS-OPIUM 16.2-60 MG RE SUPP
RECTAL | Status: DC | PRN
  Administered 2012-07-24: 1 via RECTAL

## 2012-07-24 SURGICAL SUPPLY — 41 items
ADAPTER CATH URET PLST 4-6FR (CATHETERS) IMPLANT
ADPR CATH URET STRL DISP 4-6FR (CATHETERS)
BAG DRAIN URO-CYSTO SKYTR STRL (DRAIN) ×2 IMPLANT
BAG DRN UROCATH (DRAIN) ×1
BASKET LASER NITINOL 1.9FR (BASKET) IMPLANT
BASKET STNLS GEMINI 4WIRE 3FR (BASKET) IMPLANT
BASKET ZERO TIP NITINOL 2.4FR (BASKET) ×1 IMPLANT
BRUSH URET BIOPSY 3F (UROLOGICAL SUPPLIES) IMPLANT
BSKT STON RTRVL 120 1.9FR (BASKET)
BSKT STON RTRVL GEM 120X11 3FR (BASKET)
BSKT STON RTRVL ZERO TP 2.4FR (BASKET) ×1
CANISTER SUCT LVC 12 LTR MEDI- (MISCELLANEOUS) ×1 IMPLANT
CATH INTERMIT  6FR 70CM (CATHETERS) IMPLANT
CATH URET 5FR 28IN CONE TIP (BALLOONS)
CATH URET 5FR 28IN OPEN ENDED (CATHETERS) IMPLANT
CATH URET 5FR 70CM CONE TIP (BALLOONS) IMPLANT
CLOTH BEACON ORANGE TIMEOUT ST (SAFETY) ×2 IMPLANT
DRAPE CAMERA CLOSED 9X96 (DRAPES) IMPLANT
ELECT REM PT RETURN 9FT ADLT (ELECTROSURGICAL)
ELECTRODE REM PT RTRN 9FT ADLT (ELECTROSURGICAL) IMPLANT
GLOVE BIO SURGEON STRL SZ7.5 (GLOVE) ×2 IMPLANT
GLOVE ECLIPSE 6.5 STRL STRAW (GLOVE) ×1 IMPLANT
GLOVE INDICATOR 6.5 STRL GRN (GLOVE) ×1 IMPLANT
GOWN PREVENTION PLUS LG XLONG (DISPOSABLE) ×1 IMPLANT
GOWN STRL REIN XL XLG (GOWN DISPOSABLE) ×1 IMPLANT
GUIDEWIRE 0.038 PTFE COATED (WIRE) IMPLANT
GUIDEWIRE ANG ZIPWIRE 038X150 (WIRE) IMPLANT
GUIDEWIRE STR DUAL SENSOR (WIRE) ×3 IMPLANT
IV NS IRRIG 3000ML ARTHROMATIC (IV SOLUTION) ×4 IMPLANT
KIT BALLIN UROMAX 15FX10 (LABEL) IMPLANT
KIT BALLN UROMAX 15FX4 (MISCELLANEOUS) IMPLANT
KIT BALLN UROMAX 26 75X4 (MISCELLANEOUS)
LASER FIBER DISP (UROLOGICAL SUPPLIES) ×1 IMPLANT
PACK CYSTOSCOPY (CUSTOM PROCEDURE TRAY) ×2 IMPLANT
SET HIGH PRES BAL DIL (LABEL)
SHEATH ACCESS URETERAL 38CM (SHEATH) IMPLANT
SHEATH ACCESS URETERAL 54CM (SHEATH) IMPLANT
SHEATH URET ACCESS 12FR/35CM (UROLOGICAL SUPPLIES) IMPLANT
SHEATH URET ACCESS 12FR/55CM (UROLOGICAL SUPPLIES) IMPLANT
STENT URET 6FRX28 CONTOUR (STENTS) ×1 IMPLANT
SYRINGE IRR TOOMEY STRL 70CC (SYRINGE) IMPLANT

## 2012-07-24 NOTE — Op Note (Signed)
Preoperative diagnosis: Ureteral stones, renal stones - right Postoperative diagnosis: Same  Procedure: Cystoscopy Right ureteroscopy Holmium laser lithotripsy Stone basket extraction Ureteral stent placement  Surgeon: Mena Goes Anesthesia: Gen.  Description of procedure: After consent was obtained patient brought to the operating room. A timeout was performed to confirm the patient and procedure after adequate anesthesia and the usual preparation and draping. A cystoscope was passed per urethra were the right ureteral stent was grasped and removed through the urethral meatus and a sensor wire advanced and cold in the right collecting system. A rigid ureteroscope was advanced adjacent to the wire and several small fragments were noted in the right distal ureter and these were pulled into the bladder and released with the Nitinol basket. A larger fragment was at the junction of the mid to distal ureter and this was grasped with the Nitinol basket and removed intact. Was able to get the ureteroscope as far as the iliacs but would not go any further therefore a second wire was placed under direct visualization and the scope removed. A ureteral access sheath was placed to just above the iliacs. The flexible ureteroscope was then advanced into the kidney and the collecting system inspected. In the renal pelvis and lower pole there were several fragments that were too large to pull out of the basket. Therefore one of the fragments was grasped and placed in the upper pole and this was then broken into 2 pieces with the holmium laser at a setting of 0.5 and 20. 3 other pieces along the renal pelvis and in the lower pole were also broken into 2 pieces. All of these pieces seemed small enough to remove through the scope. Therefore with the Nitinol basket all of these pieces were removed a total of about 10 fragments all 1-2 mm. Careful inspection of the collecting system noted there to be just some small  fragments remaining and a simply washed away. They appeared too small even basket and were  probably about a millimeter or less in size. therefore the access sheath was backed out on the ureteroscope and again the collecting system and entire ureter inspected and noted to be free of any clinically significant stones with the ureter being stone free and without injury or perforation. The wire was backloaded on the cystoscope and a 6 x 28 cm ureteral stent was placed. The wire was removed and a good coil seen in the kidney and the bladder. The bladder was drained and the scope removed leaving the string in place. The patient was awakened taken to recovery in stable condition.   Complications: None   Blood loss: Minimal   Specimens: Stone fragments to patient   Drains: Right 6 x 28 cm ureteral stent

## 2012-07-24 NOTE — Anesthesia Procedure Notes (Signed)
Procedure Name: LMA Insertion Date/Time: 07/24/2012 10:27 AM Performed by: Renella Cunas D Pre-anesthesia Checklist: Patient identified, Emergency Drugs available, Suction available and Patient being monitored Patient Re-evaluated:Patient Re-evaluated prior to inductionOxygen Delivery Method: Circle System Utilized Preoxygenation: Pre-oxygenation with 100% oxygen Intubation Type: IV induction Ventilation: Mask ventilation without difficulty LMA: LMA inserted LMA Size: 4.0 Number of attempts: 1 Airway Equipment and Method: bite block Placement Confirmation: positive ETCO2 Tube secured with: Tape Dental Injury: Teeth and Oropharynx as per pre-operative assessment

## 2012-07-24 NOTE — Transfer of Care (Signed)
Immediate Anesthesia Transfer of Care Note  Patient: [name] [name]  Procedure(s) Performed: Procedure(s) (LRB): CYSTOSCOPY WITH RIGHT URETEROSCOPY (Right) HOLMIUM LASER APPLICATION (Right) CYSTOSCOPY WITH STENT REMOVAL (Right) CYSTOSCOPY WITH STENT PLACEMENT (Right)  Patient Location: PACU  Anesthesia Type: General  Level of Consciousness: awake, oriented, sedated and patient cooperative  Airway & Oxygen Therapy: Patient Spontanous Breathing and Patient connected to face mask oxygen  Post-op Assessment: Report given to PACU RN and Post -op Vital signs reviewed and stable  Post vital signs: Reviewed and stable  Complications: No apparent anesthesia complications

## 2012-07-24 NOTE — Interval H&P Note (Signed)
History and Physical Interval Note:  07/24/2012 10:10 AM  Doran Stabler  has presented today for surgery, with the diagnosis of Right Nephrolithiasis. He is s/p right ESWL followed by cystoscopy ureteral stent late the next morning for pain, emesis and obstructing fragment. At the [facility] office a KUB showed residual fragments in the kidney and he wants to be stone free before removing the stent. He has been well with no fever or dysuria. He has tolerated the stent well with minimal  pain, frequency or urgency. The various methods of treatment have been discussed with the patient and family. After consideration of risks, benefits and other options for treatment, the patient has consented to  Procedure(s): CYSTOSCOPY WITH RIGHT URETEROSCOPY, HOLMIUM LASER AND STENT PLACEMENT (Right) HOLMIUM LASER APPLICATION (Right) as a surgical intervention .  The patient's history has been reviewed, patient examined, no change in status, stable for surgery.  I have reviewed the patient's chart and labs. We discussed side effects of the proposed treatment, the likelihood of the patient achieving the goals of the procedure, and any potential problems that might occur during the procedure or recuperation. All questions answered. Patient elects to proceed.      Antony Haste

## 2012-07-24 NOTE — Anesthesia Preprocedure Evaluation (Signed)
Anesthesia Evaluation  Patient identified by MRN, date of birth, ID band Patient awake    Reviewed: Allergy & Precautions, H&P , NPO status , Patient's Chart, lab work & pertinent test results  Airway Mallampati: II TM Distance: >3 FB Neck ROM: Full    Dental no notable dental hx.    Pulmonary neg pulmonary ROS,  breath sounds clear to auscultation  Pulmonary exam normal       Cardiovascular negative cardio ROS  Rhythm:Regular Rate:Normal     Neuro/Psych negative neurological ROS  negative psych ROS   GI/Hepatic negative GI ROS, Neg liver ROS,   Endo/Other  negative endocrine ROS  Renal/GU negative Renal ROS  negative genitourinary   Musculoskeletal negative musculoskeletal ROS (+)   Abdominal   Peds negative pediatric ROS (+)  Hematology negative hematology ROS (+)   Anesthesia Other Findings   Reproductive/Obstetrics negative OB ROS                           Anesthesia Physical Anesthesia Plan  ASA: I  Anesthesia Plan: General   Post-op Pain Management:    Induction: Intravenous  Airway Management Planned: LMA  Additional Equipment:   Intra-op Plan:   Post-operative Plan:   Informed Consent: I have reviewed the patients History and Physical, chart, labs and discussed the procedure including the risks, benefits and alternatives for the proposed anesthesia with the patient or authorized representative who has indicated his/her understanding and acceptance.   Dental advisory given  Plan Discussed with: CRNA  Anesthesia Plan Comments:         Anesthesia Quick Evaluation  

## 2012-07-24 NOTE — H&P (View-Only) (Signed)
  H&P  History of Present Illness: the patient presented with intermittent right flank pain April 2014. KUB showed a calcification of 1.3 cm in the region of the right UPJ. Renal ultrasound was equivocal and a CT scan confirmed stone in the right renal pelvis. Looking at today's KUB the stone appears stable in the right renal pelvis.  The patient has been well without dysuria or fever.  Past Medical History  Diagnosis Date  . Chronic kidney disease     hx of right kidney stone   Past Surgical History  Procedure Laterality Date  . Eye surgery      3-4 years ago    Home Medications:  Prescriptions prior to admission  Medication Sig Dispense Refill  . oxyCODONE-acetaminophen (PERCOCET/ROXICET) 5-325 MG per tablet Take 1 tablet by mouth every 4 (four) hours as needed for pain.      Marland Kitchen EPINEPHrine (EPIPEN 2-PAK) 0.3 mg/0.3 mL DEVI Inject 0.3 mg into the muscle once. For bee stings       Allergies:  Allergies  Allergen Reactions  . Bee Venom Anaphylaxis    History reviewed. No pertinent family history. Social History:  reports that he has quit smoking. He does not have any smokeless tobacco history on file. He reports that  drinks alcohol. He reports that he uses illicit drugs (Marijuana).  ROS: A complete review of systems was performed.  All systems are negative except for pertinent findings as noted. @ROS @   Physical Exam:  Vital signs in last 24 hours: Temp:  [98 F (36.7 C)] 98 F (36.7 C) (05/08 0815) Pulse Rate:  [69] 69 (05/08 0815) Resp:  [18] 18 (05/08 0815) BP: (130)/(78) 130/78 mmHg (05/08 0815) SpO2:  [99 %] 99 % (05/08 0815) Weight:  [107.729 kg (237 lb 8 oz)] 107.729 kg (237 lb 8 oz) (05/08 0815) General:  Alert and oriented, No acute distress HEENT: Normocephalic, atraumatic Neck: No JVD or lymphadenopathy Cardiovascular: Regular rate and rhythm Lungs: Regular rate and effort Abdomen: Soft, nontender, nondistended, no abdominal masses Back: No CVA  tenderness Extremities: No edema Neurologic: Grossly intact  Laboratory Data:  No results found for this or any previous visit (from the past 24 hour(s)). No results found for this or any previous visit (from the past 240 hour(s)). Creatinine: No results found for this basename: CREATININE,  in the last 168 hours  Impression/Assessment:   right renal stone with intermittent renal colic  Plan:  I discussed with the patient the nature, potential benefits, risks and alternatives to right extracorporeal shockwave lithotripsy, including side effects of the proposed treatment, the likelihood of the patient achieving the goals of the procedure, and any potential problems that might occur during the procedure or recuperation. All questions answered. Patient elects to proceed.  Antony Haste 07/05/2012, 10:06 AM

## 2012-07-25 ENCOUNTER — Encounter (HOSPITAL_BASED_OUTPATIENT_CLINIC_OR_DEPARTMENT_OTHER): Payer: Self-pay | Admitting: Urology

## 2012-07-25 LAB — POCT HEMOGLOBIN-HEMACUE: Hemoglobin: 14.4 g/dL (ref 13.0–17.0)

## 2012-07-25 NOTE — Anesthesia Postprocedure Evaluation (Signed)
  Anesthesia Post-op Note  Patient: [name] [name]  Procedure(s) Performed: Procedure(s) (LRB): CYSTOSCOPY WITH RIGHT URETEROSCOPY (Right) HOLMIUM LASER APPLICATION (Right) CYSTOSCOPY WITH STENT REMOVAL (Right) CYSTOSCOPY WITH STENT PLACEMENT (Right)  Patient Location: PACU  Anesthesia Type: General  Level of Consciousness: awake and alert   Airway and Oxygen Therapy: Patient Spontanous Breathing  Post-op Pain: mild  Post-op Assessment: Post-op Vital signs reviewed, Patient's Cardiovascular Status Stable, Respiratory Function Stable, Patent Airway and No signs of Nausea or vomiting  Last Vitals:  Filed Vitals:   07/24/12 1530  BP: 134/74  Pulse: 83  Temp: 36.2 C  Resp: 16    Post-op Vital Signs: stable   Complications: No apparent anesthesia complications

## 2014-07-17 IMAGING — CT CT STONE STUDY
1 of 2 series · 15 of 32 positions shown, 19 images · non-contrast
Comparison: 06/21/2012

REASON FOR EXAM: bilateral flank pain sp lithotripsy for right
urethrolithiasis today
COMMENTS:

PROCEDURE:     CT  - CT ABDOMEN /PELVIS WO (STONE)  - July 05, 2012  [DATE]
RESULT:     Indication: Flank Pain
TECHNIQUE: Multiple axial images from the lung bases to the symphysis pubis
were obtained without oral and without intravenous contrast.

[Series 2: 3mm soft tissue · axial · 0.81mm/px · z∈[+243,+738]mm · 15 of 181 slices shown, 19 images]
[im 8/181  soft-tissue]
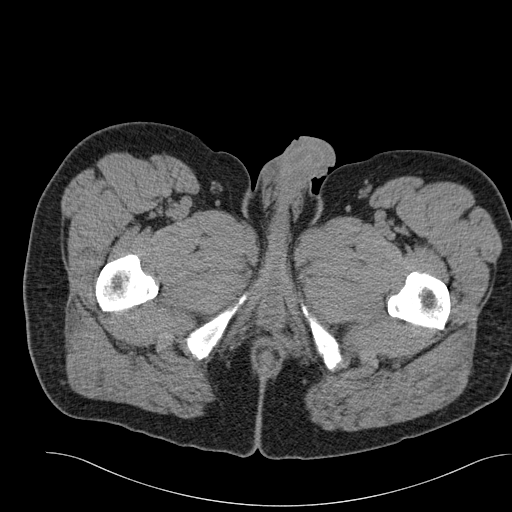
[im 8/181  bone]
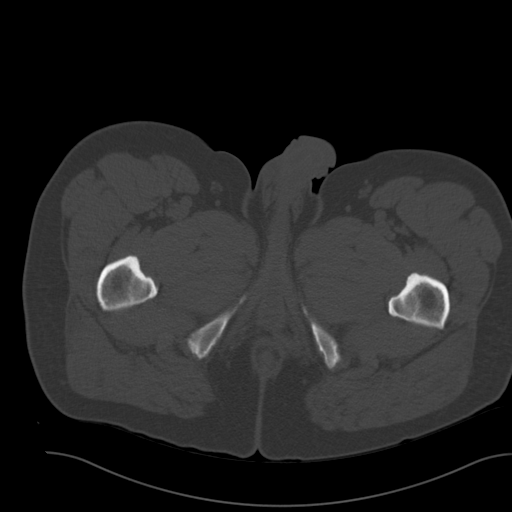
[im 23/181  soft-tissue]
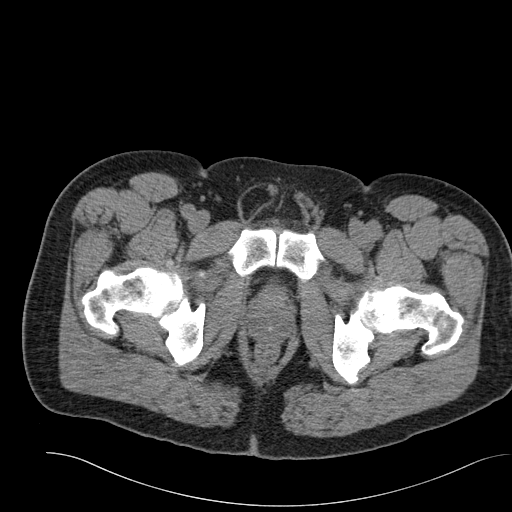
[im 38/181  soft-tissue]
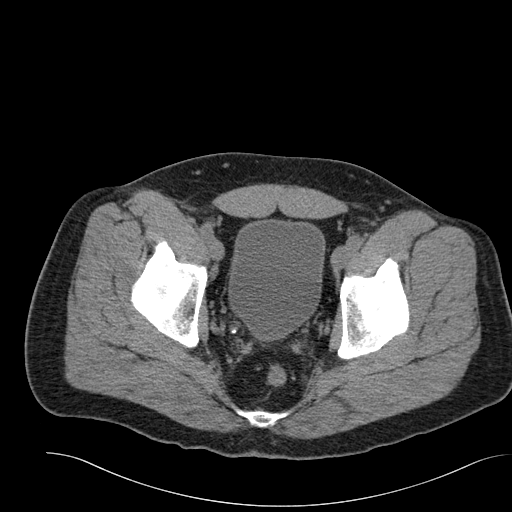
[im 53/181  soft-tissue]
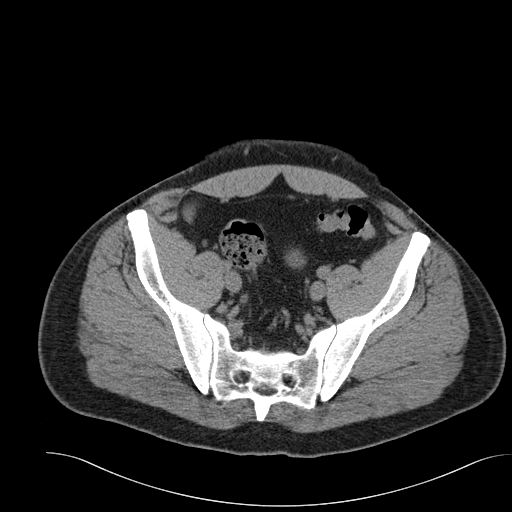
[im 61/181  soft-tissue]
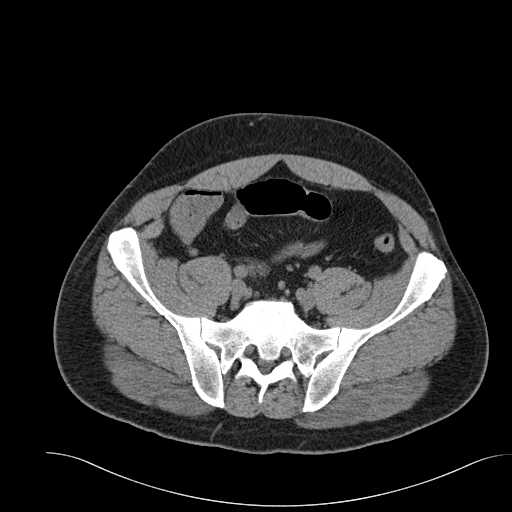
[im 76/181  soft-tissue]
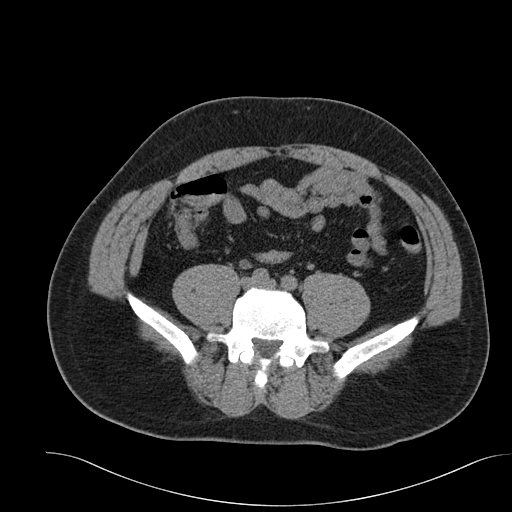
[im 91/181  soft-tissue]
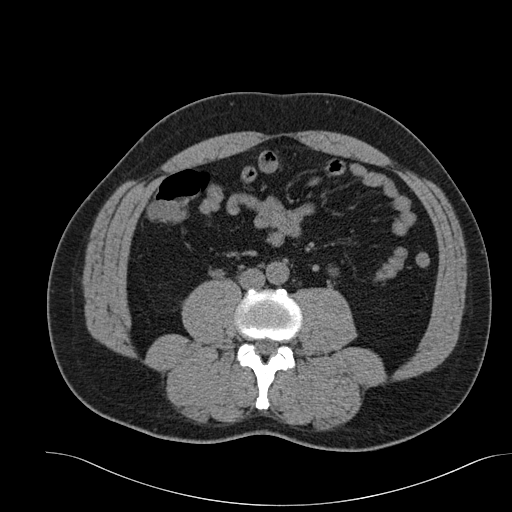
[im 106/181  soft-tissue]
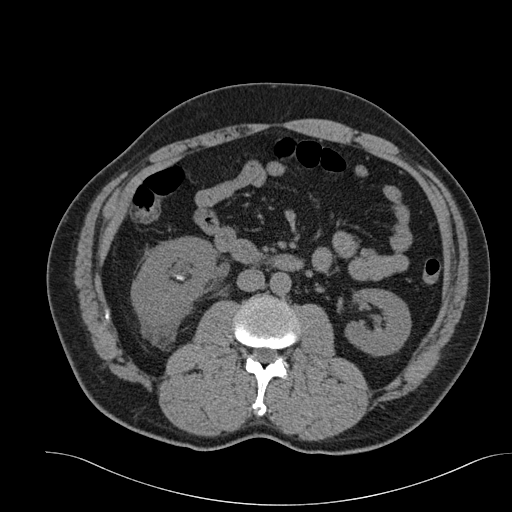
[im 121/181  soft-tissue]
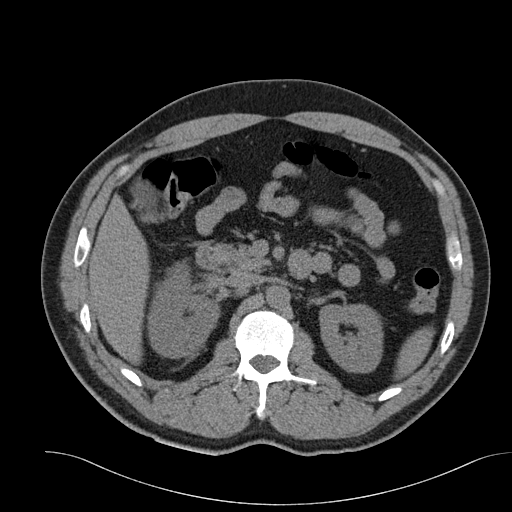
[im 121/181  bone]
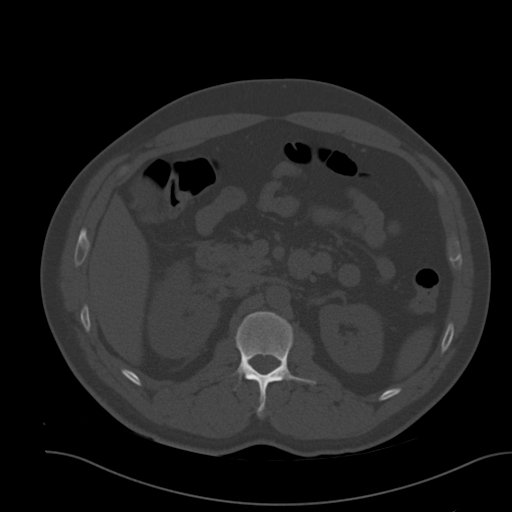
[im 128/181  soft-tissue]
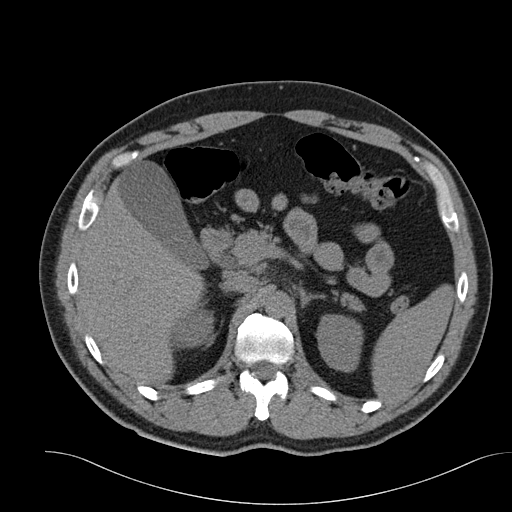
[im 143/181  soft-tissue]
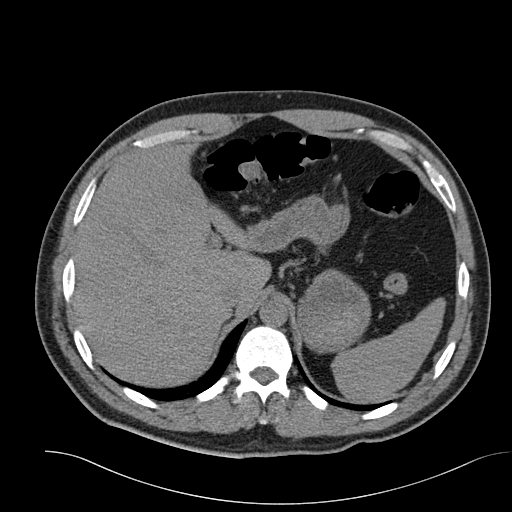
[im 151/181  lung]
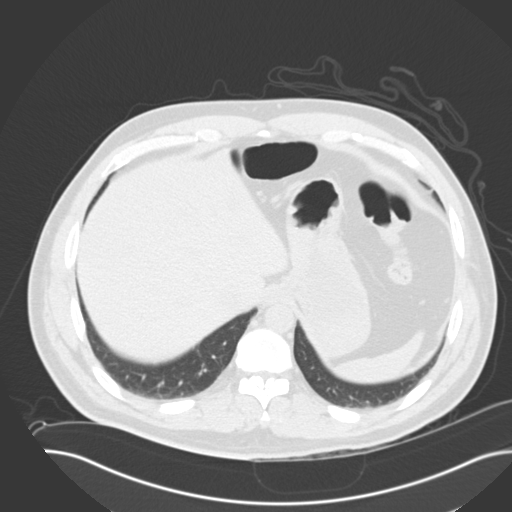
[im 158/181  soft-tissue]
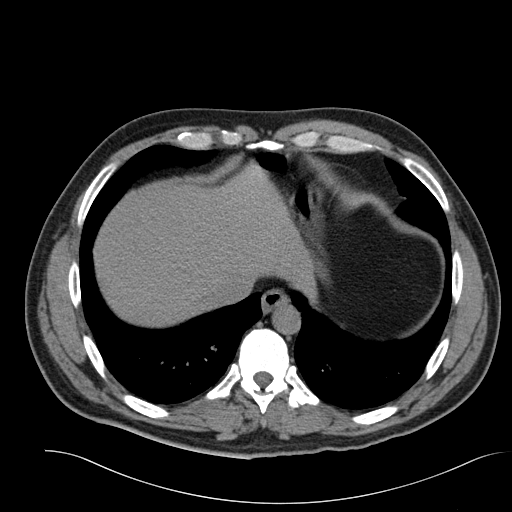
[im 158/181  lung]
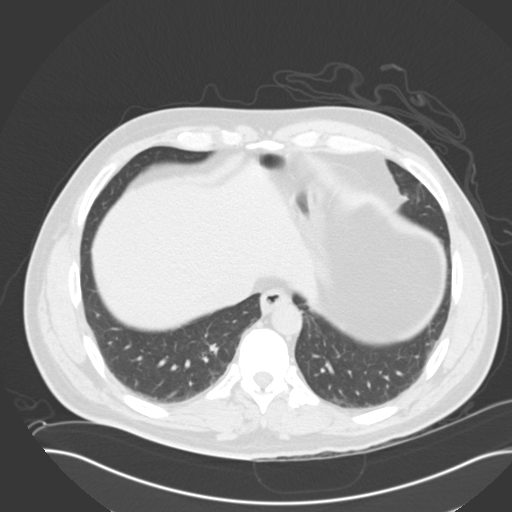
[im 166/181  lung]
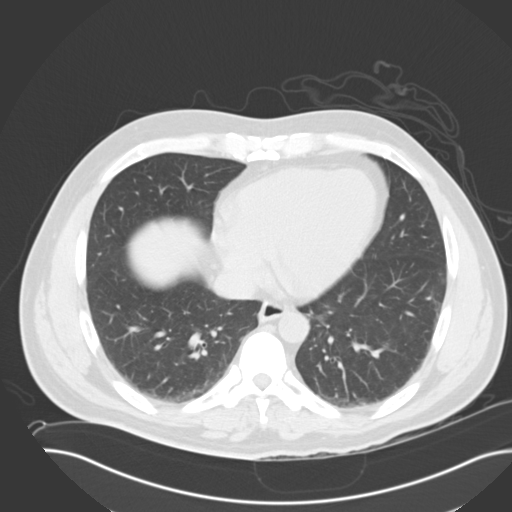
[im 173/181  soft-tissue]
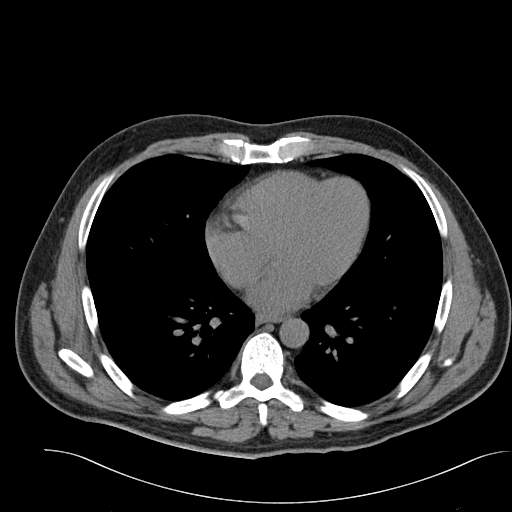
[im 173/181  lung]
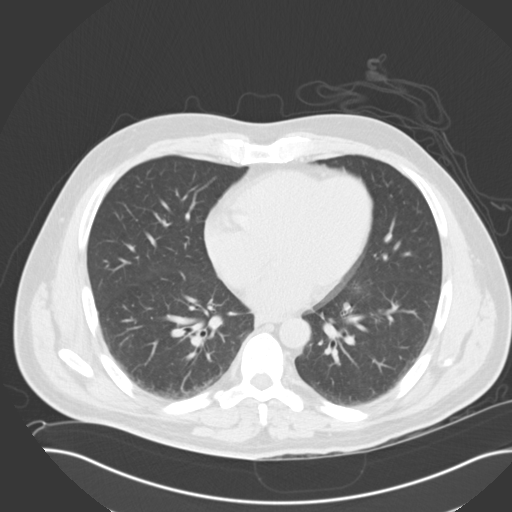

[15 of 32 positions shown; findings below may reference images not displayed]

FINDINGS: The lung bases are clear. There is no pleural or pericardial effusions.

There are multiple right nephrolithiasis. There is a 3 mm calculus at the
right UVJ. There multiple small distal right ureteral calculi also present.
There is moderate right hydroureteronephrosis and perinephric stranding. The
kidneys are symmetric in size without evidence for exophytic mass. There is
a tiny a 2 mm bladder calculus.

The liver demonstrates no focal abnormality. The gallbladder is
unremarkable. The spleen demonstrates no focal abnormality. The adrenal
glands and pancreas are normal.

The unopacified stomach, duodenum, small intestine, and large intestine are
unremarkable, but evaluation is limited by lack of oral contrast. There is a
normal caliber appendix in the right lower quadrant without periappendiceal
inflammatory changes. There is no pneumoperitoneum, pneumatosis, or portal
venous gas. There is no abdominal or pelvic free fluid. There is no
lymphadenopathy.

The abdominal aorta is normal in caliber .

The osseous structures are unremarkable.
IMPRESSION: 1. There are multiple right nephrolithiasis. There is a 3 mm calculus at the
right UVJ. There multiple small distal right ureteral calculi also present.
There is moderate right hydroureteronephrosis and perinephric stranding.
There is a tiny a 2 mm bladder calculus.

[REDACTED]

## 2014-07-17 IMAGING — CR DG ABDOMEN 1V
2 series · 2 of 2 positions shown · non-contrast
Comparison: None.

CLINICAL DATA: Pre lithotripsy for right urinary tract calculus.

ABDOMEN - 1 VIEW

[t abdomen supine (1 of 2)]
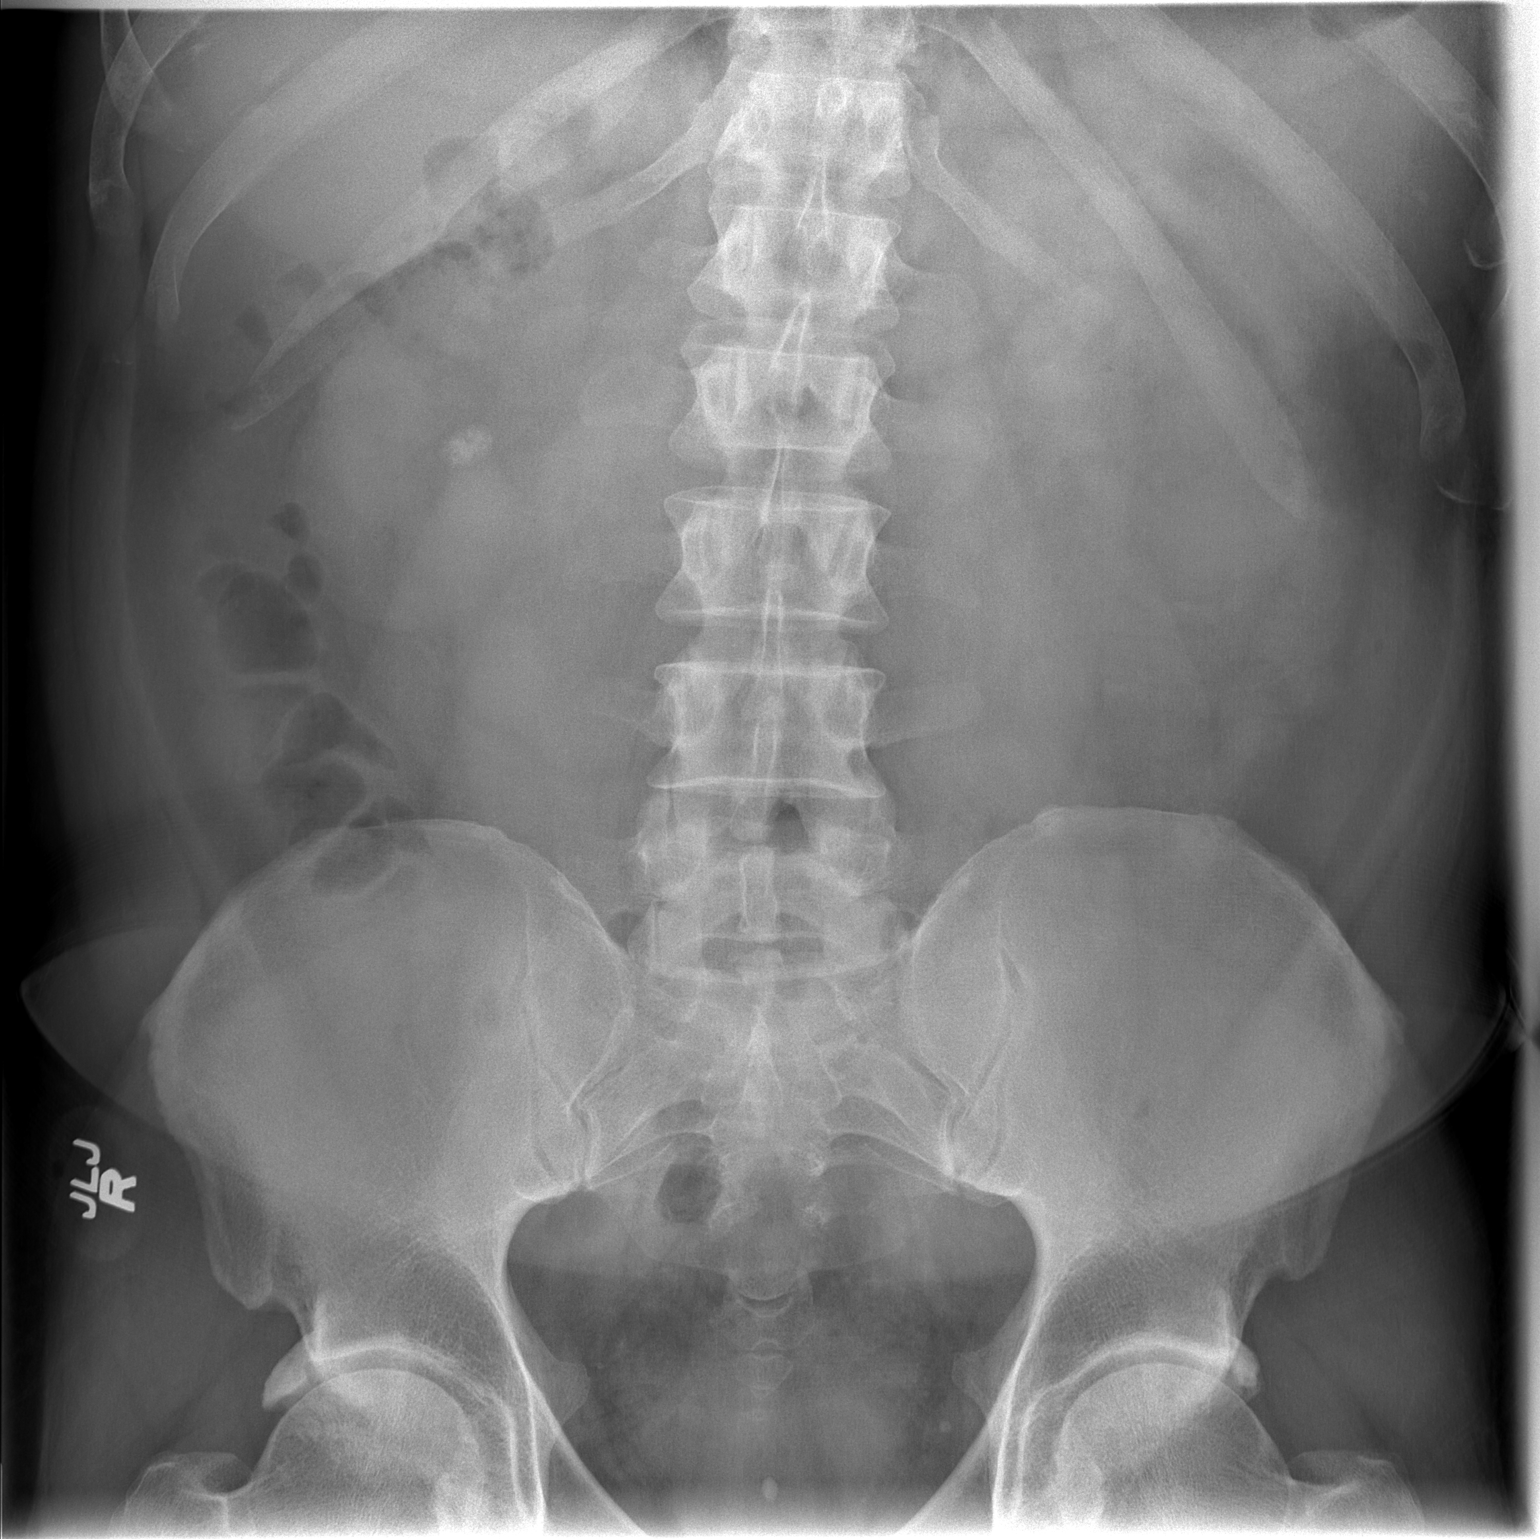

[t abdomen supine (2 of 2)]
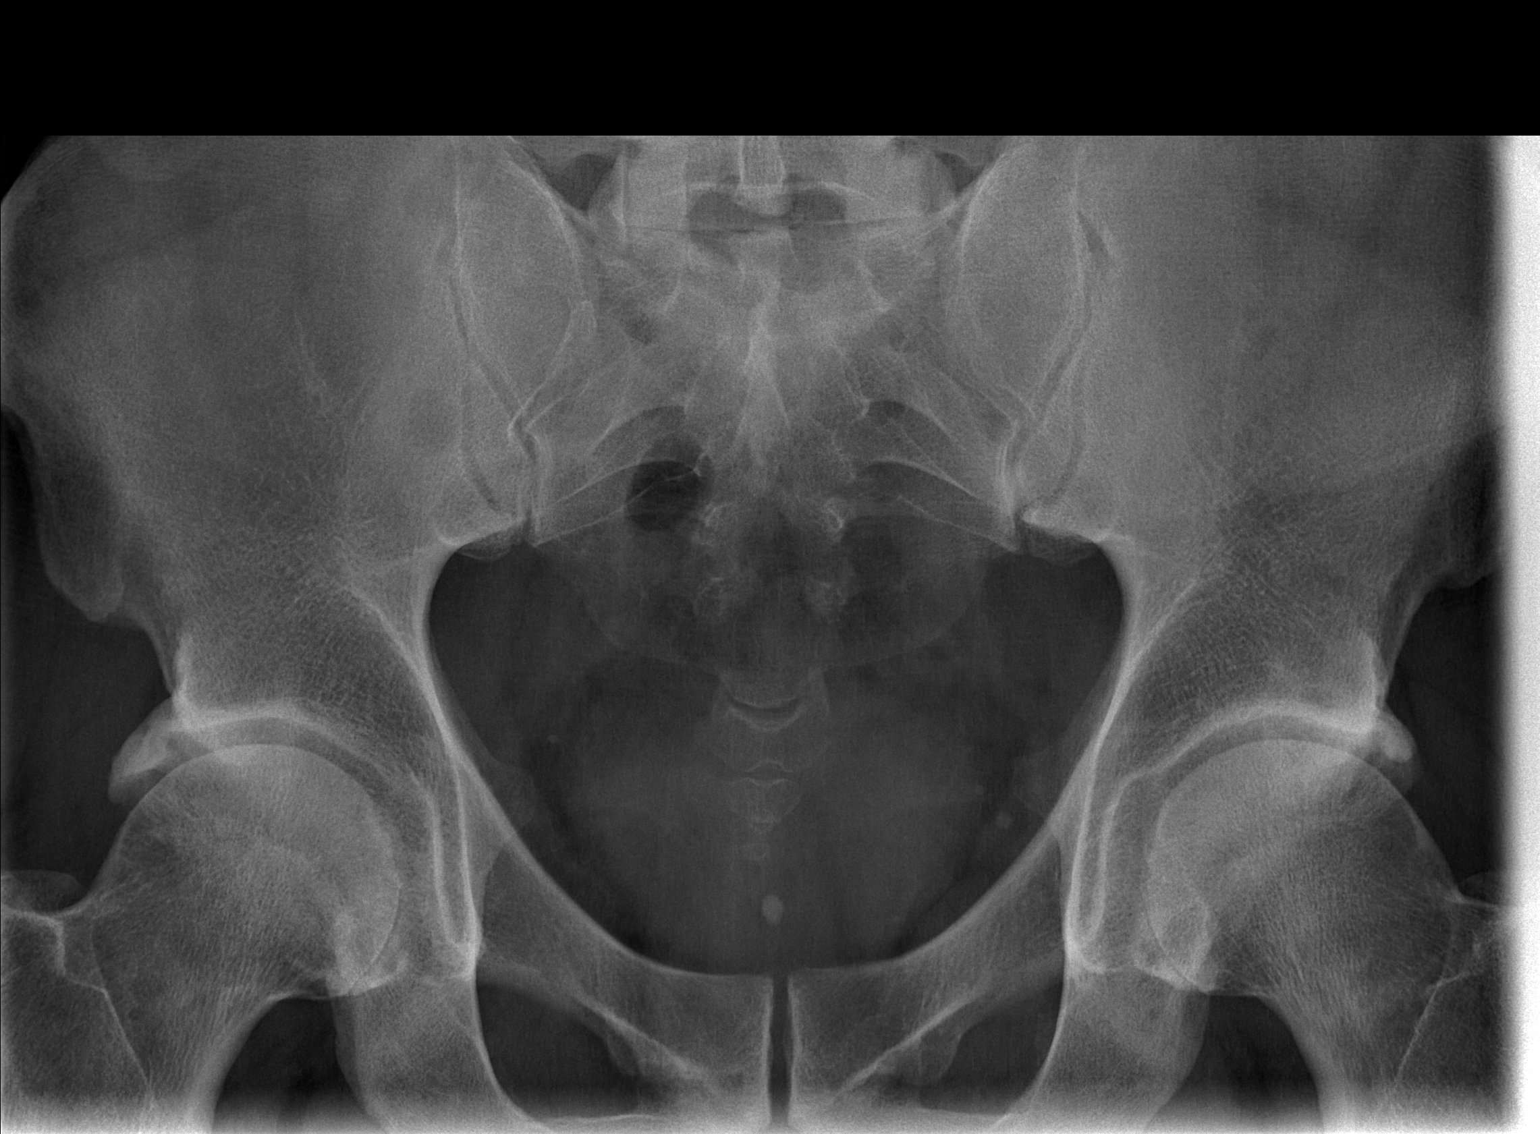

[2 of 2 positions shown; findings below may reference images not displayed]

FINDINGS: Approximate 1.4 cm calculus projected over the lower pole
of the right kidney.  No other visible opaque urinary tract
calculi.  Phleboliths in the pelvis.  Normal bowel gas pattern.
IMPRESSION: Approximate 1.4 cm right lower pole renal calculus.

## 2014-09-22 ENCOUNTER — Encounter: Payer: Self-pay | Admitting: *Deleted

## 2014-09-22 ENCOUNTER — Encounter
Admission: RE | Admit: 2014-09-22 | Discharge: 2014-09-22 | Disposition: A | Discharge status: Home / Self Care | Payer: Managed Care, Other (non HMO) | Source: Ambulatory Visit | Attending: Urology | Admitting: Urology

## 2014-09-22 DIAGNOSIS — Z87891 Personal history of nicotine dependence: Secondary | ICD-10-CM | POA: Diagnosis not present

## 2014-09-22 DIAGNOSIS — N200 Calculus of kidney: Secondary | ICD-10-CM | POA: Diagnosis present

## 2014-09-22 HISTORY — DX: Other complications of anesthesia, initial encounter: T88.59XA

## 2014-09-22 HISTORY — DX: Adverse effect of unspecified anesthetic, initial encounter: T41.45XA

## 2014-09-22 NOTE — Patient Instructions (Signed)
  Your procedure is scheduled on:09/23/14 Report to Day Surgery.[facility]OND FLOOR To find out your arrival time please call [phone] between 1PM - 3PM on 09/22/14  Remember: Instructions that are not followed completely may result in serious medical risk, up to and including death, or upon the discretion of your surgeon and anesthesiologist your surgery may need to be rescheduled.    __X__ 1. Do not eat food or drink liquids after midnight. No gum chewing or hard candies.     _X___ 2. No Alcohol for 24 hours before or after surgery.   ____ 3. Bring all medications with you on the day of surgery if instructed.  X  ____ 4. Notify your doctor if there is any change in your medical condition     (cold, fever, infections).     Do not wear jewelry, make-up, hairpins, clips or nail polish.  Do not wear lotions, powders, or perfumes. You may wear deodorant.  Do not shave 48 hours prior to surgery. Men may shave face and neck.  Do not bring valuables to the hospital.    [facility] is not responsible for any belongings or valuables.               Contacts, dentures or bridgework may not be worn into surgery.  Leave your suitcase in the car. After surgery it may be brought to your room.  For patients admitted to the hospital, discharge time is determined by your                treatment team.   Patients discharged the day of surgery will not be allowed to drive home.   Please read over the following fact sheets that you were given:   Surgical Site Infection Prevention   ____ Take these medicines the morning of surgery with A SIP OF WATER:    1.   2.   3.   4.  5.  6.  ____ Fleet Enema (as directed)   ____ Use CHG Soap as directed  ____ Use inhalers on the day of surgery  ____ Stop metformin 2 days prior to surgery    ____ Take 1/2 of usual insulin dose the night before surgery and none on the morning of surgery.   ____ Stop Coumadin/Plavix/aspirin on   ____ Stop  Anti-inflammatories on  ____ Stop supplements until after surgery.    ____ Bring C-Pap to the hospital.

## 2014-09-22 NOTE — H&P (Signed)
NAME:  [name], [name]                  ACCOUNT NO.:  [id number]  MEDICAL RECORD NO.:  [id number]  LOCATION:  [facility]                         FACILITY:  [facility]  PHYSICIAN:  [name] [name]          DATE OF BIRTH:  03/01/1965  DATE OF ADMISSION:  09/25/2014 DATE OF DISCHARGE:  09/25/2014                            HISTORY AND PHYSICAL   Same Day Surgery, September 23, 2014.  CHIEF COMPLAINT:  Flank pain.  HISTORY OF PRESENT ILLNESS:  [name] is a 49-year-old male with a 6- week history of intermittent left flank pain that became severe and was associated with nausea this week.  He was found to have a 10 x 13-mm left renal calculus.  He comes is now for cystoscopy with stent placement in preparation for lithotripsy.  PAST MEDICAL HISTORY:  No drug allergies.  CURRENT MEDICATIONS:  Nucynta, Norco and Zofran.  PREVIOUS SURGICAL PROCEDURES:  Include lithotripsy in 2003, and lithotripsy and stent placement in 2014.  SOCIAL HISTORY:  The patient denied tobacco or alcohol use.  FAMILY HISTORY:  Positive for mother with stones and father with coronary artery disease.  PAST AND CURRENT MEDICAL ADDITIONS:  Recurrent kidney stone disease.  REVIEW OF SYSTEMS:  The patient denies chest pain, shortness of breath, diabetes, hypertension or heart disease.  PHYSICAL EXAMINATION:  GENERAL:  Well-nourished white male in no acute distress. HEENT:  Sclerae are clear.  Pupils are equally round and reactive to light and accommodation. NECK:  Supple.  No palpable cervical masses. LUNGS:  Clear to auscultation. CARDIOVASCULAR:  Regular rhythm and rate without audible murmurs. ABDOMEN:  Soft, nontender abdomen. GU:  Deferred. RECTAL:  Deferred. NEUROMUSCULAR:  Nonfocal.  The patient is alert and oriented x3.  IMPRESSION:  Left nephrolithiasis.  PLAN:  Cystoscopy with stent placement.          ______________________________ [name] [name]     MW/MEDQ  D:  09/22/2014  T:  09/22/2014  Job:  [id number] 

## 2014-09-22 NOTE — H&P (Deleted)
NAMEZAVIEN, CLUBB NO.:  [id number]  MEDICAL RECORD NO.:  [id number]  LOCATION:  [facility]                         FACILITY:  [facility]  PHYSICIAN:  Anola Gurney          DATE OF BIRTH:  11-10-1965  DATE OF ADMISSION:  09/25/2014 DATE OF DISCHARGE:  09/25/2014                            HISTORY AND PHYSICAL   Same Day Surgery, September 23, 2014.  CHIEF COMPLAINT:  Flank pain.  HISTORY OF PRESENT ILLNESS:  Mr. Mcmanaman is a 49 year old male with a 6- week history of intermittent left flank pain that became severe and was associated with nausea this week.  He was found to have a 10 x 13-mm left renal calculus.  He comes is now for cystoscopy with stent placement in preparation for lithotripsy.  PAST MEDICAL HISTORY:  No drug allergies.  CURRENT MEDICATIONS:  Nucynta, Norco and Zofran.  PREVIOUS SURGICAL PROCEDURES:  Include lithotripsy in 2003, and lithotripsy and stent placement in 2014.  SOCIAL HISTORY:  The patient denied tobacco or alcohol use.  FAMILY HISTORY:  Positive for mother with stones and father with coronary artery disease.  PAST AND CURRENT MEDICAL ADDITIONS:  Recurrent kidney stone disease.  REVIEW OF SYSTEMS:  The patient denies chest pain, shortness of breath, diabetes, hypertension or heart disease.  PHYSICAL EXAMINATION:  GENERAL:  Well-nourished white male in no acute distress. HEENT:  Sclerae are clear.  Pupils are equally round and reactive to light and accommodation. NECK:  Supple.  No palpable cervical masses. LUNGS:  Clear to auscultation. CARDIOVASCULAR:  Regular rhythm and rate without audible murmurs. ABDOMEN:  Soft, nontender abdomen. GU:  Deferred. RECTAL:  Deferred. NEUROMUSCULAR:  Nonfocal.  The patient is alert and oriented x3.  IMPRESSION:  Left nephrolithiasis.  PLAN:  Cystoscopy with stent placement.          ______________________________ Anola Gurney     MW/MEDQ  D:  09/22/2014  T:  09/22/2014  Job:  [id number]

## 2014-09-22 NOTE — Patient Instructions (Signed)
  Your procedure is scheduled on: 09/23/14 Tues @ 3pm Report to Day Surgery.  Remember: Instructions that are not followed completely may result in serious medical risk, up to and including death, or upon the discretion of your surgeon and anesthesiologist your surgery may need to be rescheduled.    __x__ 1. Do not eat food or drink liquids after midnight. No gum chewing or hard candies.     __x__ 2. No Alcohol for 24 hours before or after surgery.   ____ 3. Bring all medications with you on the day of surgery if instructed.    _x___ 4. Notify your doctor if there is any change in your medical condition     (cold, fever, infections).     Do not wear jewelry, make-up, hairpins, clips or nail polish.  Do not wear lotions, powders, or perfumes. You may wear deodorant.  Do not shave 48 hours prior to surgery. Men may shave face and neck.  Do not bring valuables to the hospital.    [facility] is not responsible for any belongings or valuables.               Contacts, dentures or bridgework may not be worn into surgery.  Leave your suitcase in the car. After surgery it may be brought to your room.  For patients admitted to the hospital, discharge time is determined by your                treatment team.   Patients discharged the day of surgery will not be allowed to drive home.   Please read over the following fact sheets that you were given:      ____ Take these medicines the morning of surgery with A SIP OF WATER:    1. tapentadol (NUCYNTA) 50 MG TABS tablet  2.   3.   4.  5.  6.  ____ Fleet Enema (as directed)   ____ Use CHG Soap as directed  ____ Use inhalers on the day of surgery  ____ Stop metformin 2 days prior to surgery    ____ Take 1/2 of usual insulin dose the night before surgery and none on the morning of surgery.   ____ Stop Coumadin/Plavix/aspirin on   ____ Stop Anti-inflammatories on    ____ Stop supplements until after surgery.    ____ Bring C-Pap to  the hospital.

## 2014-09-23 ENCOUNTER — Ambulatory Visit: Payer: Managed Care, Other (non HMO) | Admitting: Anesthesiology

## 2014-09-23 ENCOUNTER — Ambulatory Visit
Admission: RE | Admit: 2014-09-23 | Discharge: 2014-09-23 | Disposition: A | Discharge status: Home / Self Care | Payer: Managed Care, Other (non HMO) | Source: Ambulatory Visit | Attending: Urology | Admitting: Urology

## 2014-09-23 ENCOUNTER — Encounter: Payer: Self-pay | Admitting: *Deleted

## 2014-09-23 ENCOUNTER — Encounter
Admission: RE | Disposition: A | Discharge status: Home / Self Care | Payer: Self-pay | Source: Ambulatory Visit | Attending: Urology

## 2014-09-23 DIAGNOSIS — Z87891 Personal history of nicotine dependence: Secondary | ICD-10-CM | POA: Insufficient documentation

## 2014-09-23 DIAGNOSIS — N200 Calculus of kidney: Secondary | ICD-10-CM | POA: Insufficient documentation

## 2014-09-23 HISTORY — PX: CYSTOSCOPY WITH STENT PLACEMENT: SHX5790

## 2014-09-23 SURGERY — CYSTOSCOPY, WITH STENT INSERTION
Anesthesia: General | Laterality: Left

## 2014-09-23 MED ORDER — HYOSCYAMINE SULFATE 0.125 MG PO TABS
ORAL_TABLET | ORAL | Status: AC
  Filled 2014-09-23: qty 1

## 2014-09-23 MED ORDER — FAMOTIDINE 20 MG PO TABS
ORAL_TABLET | ORAL | Status: AC
  Administered 2014-09-23: 20 mg via ORAL
  Filled 2014-09-23: qty 1

## 2014-09-23 MED ORDER — URIBEL 118 MG PO CAPS: 1.0000 | ORAL_CAPSULE | Freq: Four times a day (QID) | ORAL | Status: AC | PRN

## 2014-09-23 MED ORDER — BELLADONNA ALKALOIDS-OPIUM 16.2-60 MG RE SUPP
RECTAL | Status: DC | PRN
  Administered 2014-09-23: 1 via RECTAL

## 2014-09-23 MED ORDER — HYOSCYAMINE SULFATE 0.125 MG SL SUBL
0.1250 mg | SUBLINGUAL_TABLET | Freq: Once | SUBLINGUAL | Status: AC
  Administered 2014-09-23: 0.125 mg via SUBLINGUAL

## 2014-09-23 MED ORDER — NUCYNTA 50 MG PO TABS: 50.0000 mg | ORAL_TABLET | Freq: Four times a day (QID) | ORAL | Status: DC | PRN

## 2014-09-23 MED ORDER — LIDOCAINE HCL 2 % EX GEL
CUTANEOUS | Status: AC
  Filled 2014-09-23: qty 10

## 2014-09-23 MED ORDER — LIDOCAINE HCL 2 % EX GEL
CUTANEOUS | Status: DC | PRN
  Administered 2014-09-23: 1 via URETHRAL

## 2014-09-23 MED ORDER — HYOSCYAMINE SULFATE 0.125 MG SL SUBL: 0.1250 mg | SUBLINGUAL_TABLET | SUBLINGUAL | Status: AC | PRN

## 2014-09-23 MED ORDER — LEVOFLOXACIN IN D5W 500 MG/100ML IV SOLN: 500.0000 mg | INTRAVENOUS | Status: AC

## 2014-09-23 MED ORDER — LEVOFLOXACIN IN D5W 500 MG/100ML IV SOLN
INTRAVENOUS | Status: AC
  Administered 2014-09-23: 500 mg via INTRAVENOUS
  Filled 2014-09-23: qty 100

## 2014-09-23 MED ORDER — PROPOFOL 10 MG/ML IV BOLUS
INTRAVENOUS | Status: DC | PRN
  Administered 2014-09-23: 200 mg via INTRAVENOUS

## 2014-09-23 MED ORDER — LACTATED RINGERS IV SOLN
INTRAVENOUS | Status: DC
  Administered 2014-09-23 (×2): via INTRAVENOUS

## 2014-09-23 MED ORDER — BELLADONNA ALKALOIDS-OPIUM 16.2-60 MG RE SUPP
RECTAL | Status: AC
  Filled 2014-09-23: qty 1

## 2014-09-23 MED ORDER — IOTHALAMATE MEGLUMINE 43 % IV SOLN
INTRAVENOUS | Status: DC | PRN
  Administered 2014-09-23: 20 mL

## 2014-09-23 MED ORDER — LIDOCAINE HCL (CARDIAC) 20 MG/ML IV SOLN
INTRAVENOUS | Status: DC | PRN
  Administered 2014-09-23: 60 mg via INTRAVENOUS

## 2014-09-23 MED ORDER — MIDAZOLAM HCL 5 MG/5ML IJ SOLN
INTRAMUSCULAR | Status: DC | PRN
  Administered 2014-09-23: 2 mg via INTRAVENOUS

## 2014-09-23 MED ORDER — FENTANYL CITRATE (PF) 100 MCG/2ML IJ SOLN
INTRAMUSCULAR | Status: AC
  Administered 2014-09-23: 25 ug via INTRAVENOUS
  Filled 2014-09-23: qty 2

## 2014-09-23 MED ORDER — FENTANYL CITRATE (PF) 100 MCG/2ML IJ SOLN
INTRAMUSCULAR | Status: DC | PRN
  Administered 2014-09-23: 25 ug via INTRAVENOUS
  Administered 2014-09-23: 50 ug via INTRAVENOUS
  Administered 2014-09-23: 25 ug via INTRAVENOUS

## 2014-09-23 MED ORDER — ONDANSETRON HCL 4 MG/2ML IJ SOLN: 4.0000 mg | Freq: Once | INTRAMUSCULAR | Status: DC | PRN

## 2014-09-23 MED ORDER — FENTANYL CITRATE (PF) 100 MCG/2ML IJ SOLN
25.0000 ug | INTRAMUSCULAR | Status: DC | PRN
  Administered 2014-09-23 (×4): 25 ug via INTRAVENOUS

## 2014-09-23 MED ORDER — FAMOTIDINE 20 MG PO TABS: 20.0000 mg | ORAL_TABLET | Freq: Once | ORAL | Status: DC

## 2014-09-23 MED ORDER — FAMOTIDINE 20 MG PO TABS: 20.0000 mg | ORAL_TABLET | Freq: Once | ORAL | Status: AC

## 2014-09-23 MED ORDER — LEVOFLOXACIN 500 MG PO TABS: 500.0000 mg | ORAL_TABLET | Freq: Every day | ORAL | Status: AC

## 2014-09-23 MED ORDER — LEVOFLOXACIN IN D5W 500 MG/100ML IV SOLN: 500.0000 mg | INTRAVENOUS | Status: DC

## 2014-09-23 SURGICAL SUPPLY — 22 items
BAG DRAIN CYSTO-URO LG1000N (MISCELLANEOUS) ×2 IMPLANT
CONRAY 43 FOR UROLOGY 50M (MISCELLANEOUS) ×2 IMPLANT
GLOVE BIO SURGEON STRL SZ7 (GLOVE) ×4 IMPLANT
GLOVE BIO SURGEON STRL SZ7.5 (GLOVE) ×2 IMPLANT
GOWN STRL REUS W/ TWL LRG LVL3 (GOWN DISPOSABLE) ×1 IMPLANT
GOWN STRL REUS W/ TWL XL LVL3 (GOWN DISPOSABLE) ×1 IMPLANT
GOWN STRL REUS W/TWL LRG LVL3 (GOWN DISPOSABLE) ×2
GOWN STRL REUS W/TWL XL LVL3 (GOWN DISPOSABLE) ×2
GUIDEWIRE STR ZIPWIRE 035X150 (MISCELLANEOUS) ×2 IMPLANT
KIT RM TURNOVER CYSTO AR (KITS) ×2 IMPLANT
OPEN-ENDED CATHETER ×2 IMPLANT
PACK CYSTO AR (MISCELLANEOUS) ×2 IMPLANT
PREP PVP WINGED SPONGE (MISCELLANEOUS) ×2 IMPLANT
SET CYSTO W/LG BORE CLAMP LF (SET/KITS/TRAYS/PACK) ×2 IMPLANT
SOL .9 NS 3000ML IRR  AL (IV SOLUTION) ×1
SOL .9 NS 3000ML IRR AL (IV SOLUTION) ×1
SOL .9 NS 3000ML IRR UROMATIC (IV SOLUTION) ×1 IMPLANT
SOL PREP PVP 2OZ (MISCELLANEOUS) ×2
SOLUTION PREP PVP 2OZ (MISCELLANEOUS) ×1 IMPLANT
STENT URET 6FRX24 CONTOUR (STENTS) ×2 IMPLANT
STENT URET 6FRX26 CONTOUR (STENTS) ×3 IMPLANT
WATER STERILE IRR 1000ML POUR (IV SOLUTION) ×2 IMPLANT

## 2014-09-23 NOTE — Transfer of Care (Signed)
Immediate Anesthesia Transfer of Care Note  Patient: [name] [name]  Procedure(s) Performed: Procedure(s): CYSTOSCOPY WITH STENT PLACEMENT (Left)  Patient Location: PACU  Anesthesia Type:General  Level of Consciousness: awake and patient cooperative  Airway & Oxygen Therapy: Patient Spontanous Breathing and Patient connected to face mask oxygen  Post-op Assessment: Report given to RN  Post vital signs: Reviewed and stable  Last Vitals:  Filed Vitals:   09/23/14 1712  BP: 133/88  Pulse: 70  Temp: 36.8 C  Resp: 12    Complications: No apparent anesthesia complications

## 2014-09-23 NOTE — Anesthesia Procedure Notes (Signed)
Procedure Name: LMA Insertion Date/Time: 09/23/2014 4:34 PM Performed by: Lily Kocher Pre-anesthesia Checklist: Patient identified, Patient being monitored, Timeout performed, Emergency Drugs available and Suction available Patient Re-evaluated:Patient Re-evaluated prior to inductionOxygen Delivery Method: Circle system utilized Preoxygenation: Pre-oxygenation with 100% oxygen Intubation Type: IV induction Ventilation: Mask ventilation without difficulty LMA: LMA inserted LMA Size: 5.0 Number of attempts: 1 Placement Confirmation: positive ETCO2 and breath sounds checked- equal and bilateral Tube secured with: Tape Dental Injury: Teeth and Oropharynx as per pre-operative assessment

## 2014-09-23 NOTE — Anesthesia Preprocedure Evaluation (Addendum)
Anesthesia Evaluation  Patient identified by MRN, date of birth, ID band Patient awake    Reviewed: Allergy & Precautions, NPO status , Patient's Chart, lab work & pertinent test results  History of Anesthesia Complications (+) history of anesthetic complications (hx of awareness during lithotripsy, hard time waking up)  Airway Mallampati: II  TM Distance: >3 FB Neck ROM: Full    Dental  (+) Teeth Intact   Pulmonary former smoker (quit x 5 yrs),          Cardiovascular     Neuro/Psych    GI/Hepatic   Endo/Other    Renal/GU Renal InsufficiencyRenal disease (Stones)     Musculoskeletal   Abdominal   Peds  Hematology   Anesthesia Other Findings   Reproductive/Obstetrics                            Anesthesia Physical Anesthesia Plan  ASA: II  Anesthesia Plan: General   Post-op Pain Management:    Induction: Intravenous  Airway Management Planned: LMA  Additional Equipment:   Intra-op Plan:   Post-operative Plan:   Informed Consent: I have reviewed the patients History and Physical, chart, labs and discussed the procedure including the risks, benefits and alternatives for the proposed anesthesia with the patient or authorized representative who has indicated his/her understanding and acceptance.     Plan Discussed with:   Anesthesia Plan Comments:         Anesthesia Quick Evaluation

## 2014-09-23 NOTE — Op Note (Signed)
Preoperative diagnosis: Left nephrolithiasis Postoperative diagnosis: Same  Procedure: 1. Left double pigtail stent placement                      2. Cystoscopy with left retrograde pyelogram   Surgeon: Suszanne Conners. Evelene Croon MD, FACS Anesthesia: Gen.  Indications:See the history and physical. After informed consent the above procedure(s) were requested     Technique and findings: After adequate general anesthesia had been obtained the patient was placed into dorsal lithotomy position and the perineum was prepped and draped in the usual fashion. Fluoroscopy confirmed the presence of a large left renal calculus. At this point the 13 [place] the scope was coupled to the camera and visually advanced into the bladder. Bladder was thoroughly inspected. Both ureteral orifices were identified and had clear reflux. No bladder tumors were identified. An open-ended 6 French ureteral catheter was advanced into the left ureteral orifice and retrograde pyelogram was performed. Static and fluoroscopic revealed that the stone was located and a lower pole infundibulum. No other filling defects were seen. No hydronephrosis was present. At this point a 0.035 Glidewire was advanced up the left ureter under fluoroscopic guidance. A 6 x 26 cm double pigtail stent was advanced over the guidewire and positioned in the ureter. The guidewire was then removed taking care leave the stent in position. The suture was left attached to the stent for later retrieval. The bladder was drained and the cystoscope was removed. 10 cc of viscous Xylocaine was instilled within the urethra and the bladder. A B&O suppository was placed. Procedure was then terminated and the patient was transferred to the recovery room in stable condition.

## 2014-09-23 NOTE — Discharge Instructions (Addendum)
Do not pull on string

## 2014-09-23 NOTE — Anesthesia Postprocedure Evaluation (Signed)
  Anesthesia Post-op Note  Patient: [name] [name]  Procedure(s) Performed: Procedure(s): CYSTOSCOPY WITH STENT PLACEMENT (Left)  Anesthesia type:General  Patient location: PACU  Post pain: Pain level controlled  Post assessment: Post-op Vital signs reviewed, Patient's Cardiovascular Status Stable, Respiratory Function Stable, Patent Airway and No signs of Nausea or vomiting  Post vital signs: Reviewed and stable  Last Vitals:  Filed Vitals:   09/23/14 1742  BP: 130/94  Pulse: 64  Temp:   Resp: 15    Level of consciousness: awake, alert  and patient cooperative  Complications: No apparent anesthesia complications

## 2014-09-23 NOTE — H&P (Signed)
Date of Initial H&P: 09/22/14  History reviewed, patient examined, no change in status, stable for surgery.

## 2014-09-24 ENCOUNTER — Encounter: Payer: Self-pay | Admitting: Urology

## 2014-09-25 ENCOUNTER — Encounter
Admission: AD | Disposition: A | Discharge status: Home / Self Care | Payer: Self-pay | Source: Ambulatory Visit | Attending: Urology

## 2014-09-25 ENCOUNTER — Encounter: Payer: Self-pay | Admitting: *Deleted

## 2014-09-25 ENCOUNTER — Ambulatory Visit
Admission: AD | Admit: 2014-09-25 | Discharge: 2014-09-25 | Disposition: A | Discharge status: Home / Self Care | Payer: Managed Care, Other (non HMO) | Source: Ambulatory Visit | Attending: Urology | Admitting: Urology

## 2014-09-25 DIAGNOSIS — Z841 Family history of disorders of kidney and ureter: Secondary | ICD-10-CM | POA: Diagnosis not present

## 2014-09-25 DIAGNOSIS — Z79899 Other long term (current) drug therapy: Secondary | ICD-10-CM | POA: Insufficient documentation

## 2014-09-25 DIAGNOSIS — Z8249 Family history of ischemic heart disease and other diseases of the circulatory system: Secondary | ICD-10-CM | POA: Diagnosis not present

## 2014-09-25 DIAGNOSIS — Z87442 Personal history of urinary calculi: Secondary | ICD-10-CM | POA: Diagnosis not present

## 2014-09-25 DIAGNOSIS — Z9889 Other specified postprocedural states: Secondary | ICD-10-CM | POA: Diagnosis not present

## 2014-09-25 DIAGNOSIS — Z79891 Long term (current) use of opiate analgesic: Secondary | ICD-10-CM | POA: Diagnosis not present

## 2014-09-25 DIAGNOSIS — N200 Calculus of kidney: Secondary | ICD-10-CM | POA: Diagnosis not present

## 2014-09-25 HISTORY — PX: EXTRACORPOREAL SHOCK WAVE LITHOTRIPSY: SHX1557

## 2014-09-25 SURGERY — LITHOTRIPSY, ESWL
Anesthesia: IV Sedation (MBSC Only) | Laterality: Left

## 2014-09-25 MED ORDER — DIPHENHYDRAMINE HCL 25 MG PO CAPS
25.0000 mg | ORAL_CAPSULE | ORAL | Status: AC
  Administered 2014-09-25: 25 mg via ORAL

## 2014-09-25 MED ORDER — PROMETHAZINE HCL 25 MG/ML IJ SOLN
25.0000 mg | Freq: Once | INTRAMUSCULAR | Status: AC
  Administered 2014-09-25: 25 mg via INTRAMUSCULAR

## 2014-09-25 MED ORDER — ONDANSETRON 8 MG PO TBDP: 8.0000 mg | ORAL_TABLET | Freq: Four times a day (QID) | ORAL | Status: AC | PRN

## 2014-09-25 MED ORDER — HYDROMORPHONE HCL 1 MG/ML IJ SOLN: 1.0000 mg | Freq: Once | INTRAMUSCULAR | Status: DC

## 2014-09-25 MED ORDER — MIDAZOLAM HCL 2 MG/2ML IJ SOLN
1.0000 mg | Freq: Once | INTRAMUSCULAR | Status: AC
  Administered 2014-09-25: 1 mg via INTRAMUSCULAR

## 2014-09-25 MED ORDER — HYDROMORPHONE HCL 1 MG/ML IJ SOLN
INTRAMUSCULAR | Status: AC
  Administered 2014-09-25: 1 mg
  Filled 2014-09-25: qty 1

## 2014-09-25 MED ORDER — DEXTROSE-NACL 5-0.45 % IV SOLN
INTRAVENOUS | Status: DC
  Administered 2014-09-25: 07:00:00 via INTRAVENOUS

## 2014-09-25 MED ORDER — LEVOFLOXACIN 500 MG PO TABS
500.0000 mg | ORAL_TABLET | ORAL | Status: AC
  Administered 2014-09-25: 500 mg via ORAL

## 2014-09-25 MED ORDER — MORPHINE SULFATE 10 MG/ML IJ SOLN
10.0000 mg | Freq: Once | INTRAMUSCULAR | Status: AC
  Administered 2014-09-25: 10 mg via INTRAMUSCULAR

## 2014-09-25 NOTE — Discharge Instructions (Addendum)
Dietary Guidelines to Help Prevent Kidney Stones Your risk of kidney stones can be decreased by adjusting the foods you eat. The most important thing you can do is drink enough fluid. You should drink enough fluid to keep your urine clear or pale yellow. The following guidelines provide specific information for the type of kidney stone you have had. GUIDELINES ACCORDING TO TYPE OF KIDNEY STONE Calcium Oxalate Kidney Stones  Reduce the amount of salt you eat. Foods that have a lot of salt cause your body to release excess calcium into your urine. The excess calcium can combine with a substance called oxalate to form kidney stones.  Reduce the amount of animal protein you eat if the amount you eat is excessive. Animal protein causes your body to release excess calcium into your urine. Ask your dietitian how much protein from animal sources you should be eating.  Avoid foods that are high in oxalates. If you take vitamins, they should have less than 500 mg of vitamin C. Your body turns vitamin C into oxalates. You do not need to avoid fruits and vegetables high in vitamin C. Calcium Phosphate Kidney Stones  Reduce the amount of salt you eat to help prevent the release of excess calcium into your urine.  Reduce the amount of animal protein you eat if the amount you eat is excessive. Animal protein causes your body to release excess calcium into your urine. Ask your dietitian how much protein from animal sources you should be eating.  Get enough calcium from food or take a calcium supplement (ask your dietitian for recommendations). Food sources of calcium that do not increase your risk of kidney stones include:  Broccoli.  Dairy products, such as cheese and yogurt.  Pudding. Uric Acid Kidney Stones  Do not have more than 6 oz of animal protein per day. FOOD SOURCES Animal Protein Sources  Meat (all types).  Poultry.  Eggs.  Fish, seafood. Foods High in [employer] seasonings.  Soy  sauce.  Teriyaki sauce.  Cured and processed meats.  Salted crackers and snack foods.  Fast food.  Canned soups and most canned foods. Foods High in Oxalates  Grains:  Amaranth.  Barley.  Grits.  Wheat germ.  Bran.  Buckwheat flour.  All bran cereals.  Pretzels.  Whole wheat bread.  Vegetables:  Beans (wax).  Beets and beet greens.  Collard greens.  Eggplant.  Escarole.  Leeks.  Okra.  Parsley.  Rutabagas.  Spinach.  Swiss chard.  Tomato paste.  Fried potatoes.  Sweet potatoes.  Fruits:  Red currants.  Figs.  Kiwi.  Rhubarb.  Meat and Other Protein Sources:  Beans (dried).  Soy burgers and other soybean products.  Miso.  Nuts (peanuts, almonds, pecans, cashews, hazelnuts).  Nut butters.  Sesame seeds and tahini (paste made of sesame seeds).  Poppy seeds.  Beverages:  Chocolate drink mixes.  Soy milk.  Instant iced tea.  Juices made from high-oxalate fruits or vegetables.  Other:  Carob.  Chocolate.  Fruitcake.  Marmalades. Document Released: 06/11/2010 Document Revised: 02/19/2013 Document Reviewed: 01/11/2013 [facility] Patient Information 2015 [place], [place]. This information is not intended to replace advice given to you by your health care provider. Make sure you discuss any questions you have with your health care provider.  Kidney Stones Kidney stones (urolithiasis) are solid masses that form inside your kidneys. The intense pain is caused by the stone moving through the kidney, ureter, bladder, and urethra (urinary tract). When the stone moves, the  ureter starts to spasm around the stone. The stone is usually passed in your pee (urine).  HOME CARE  Drink enough fluids to keep your pee clear or pale yellow. This helps to get the stone out.  Strain all pee through the provided strainer. Do not pee without peeing through the strainer, not even once. If you pee the stone out, catch it in the  strainer. The stone may be as small as a grain of salt. Take this to your doctor. This will help your doctor figure out what you can do to try to prevent more kidney stones.  Only take medicine as told by your doctor.  Follow up with your doctor as told.  Get follow-up X-rays as told by your doctor. GET HELP IF: You have pain that gets worse even if you have been taking pain medicine. GET HELP RIGHT AWAY IF:   Your pain does not get better with medicine.  You have a fever or shaking chills.  Your pain increases and gets worse over 18 hours.  You have new belly (abdominal) pain.  You feel faint or pass out.  You are unable to pee. MAKE SURE YOU:   Understand these instructions.  Will watch your condition.  Will get help right away if you are not doing well or get worse. Document Released: 08/03/2007 Document Revised: 10/17/2012 Document Reviewed: 07/18/2012 [facility] Patient Information 2015 [place], [place]. This information is not intended to replace advice given to you by your health care provider. Make sure you discuss any questions you have with your health care provider.  Lithotripsy for Kidney Stones Lithotripsy is a treatment that can sometimes help eliminate kidney stones and pain that they cause. A form of lithotripsy, also known as extracorporeal shock wave lithotripsy, is a nonsurgical procedure that helps your body rid itself of the kidney stone when it is too big to pass on its own. Extracorporeal shock wave lithotripsy is a method of crushing a kidney stone with shock waves. These shock waves pass through your body and are focused on your stone. They cause the kidney stones to crumble while still in the urinary tract. It is then easier for the smaller pieces of stone to pass in the urine. Lithotripsy usually takes about an hour. It is done in a hospital, a lithotripsy center, or a mobile unit. It usually does not require an overnight stay. Your health care provider will  instruct you on preparation for the procedure. Your health care provider will tell you what to expect afterward. LET [facility] CARE PROVIDER KNOW ABOUT:  Any allergies you have.  All medicines you are taking, including vitamins, herbs, eye drops, creams, and over-the-counter medicines.  Previous problems you or members of your family have had with the use of anesthetics.  Any blood disorders you have.  Previous surgeries you have had.  Medical conditions you have. RISKS AND COMPLICATIONS Generally, lithotripsy for kidney stones is a safe procedure. However, as with any procedure, complications can occur. Possible complications include:  Infection.  Bleeding of the kidney.  Bruising of the kidney or skin.  Obstruction of the ureter.  Failure of the stone to fragment. BEFORE THE PROCEDURE  Do not eat or drink for 6-8 hours prior to the procedure. You may, however, take the medications with a sip of water that your physician instructs you to take  Do not take aspirin or aspirin-containing products for 7 days prior to your procedure  Do not take nonsteroidal anti-inflammatory products for  7 days prior to your procedure PROCEDURE A stent (flexible tube with holes) may be placed in your ureter. The ureter is the tube that transports the urine from the kidneys to the bladder. Your health care provider may place a stent before the procedure. This will help keep urine flowing from the kidney if the fragments of the stone block the ureter. You may have an IV tube placed in one of your veins to give you fluids and medicines. These medicines may help you relax or make you sleep. During the procedure, you will lie comfortably on a fluid-filled cushion or in a warm-water bath. After an X-ray or ultrasound exam to locate your stone, shock waves are aimed at the stone. If you are awake, you may feel a tapping sensation as the shock waves pass through your body. If large stone particles remain  after treatment, a second procedure may be necessary at a later date. For comfort during the test:  Relax as much as possible.  Try to remain still as much as possible.  Try to follow instructions to speed up the test.  Let your health care provider know if you are uncomfortable, anxious, or in pain. AFTER THE PROCEDURE  After surgery, you will be taken to the recovery area. A nurse will watch and check your progress. Once you're awake, stable, and taking fluids well, you will be allowed to go home as long as there are no problems. You will also be allowed to pass your urine before discharge.You may be given antibiotics to help prevent infection. You may also be prescribed pain medicine if needed. In a week or two, your health care provider may remove your stent, if you have one. You may first have an X-ray exam to check on how successful the fragmentation of your stone has been and how much of the stone has passed. Your health care provider will check to see whether or not stone particles remain. SEEK IMMEDIATE MEDICAL CARE IF:  You develop a fever or shaking chills.  Your pain is not relieved by medicine.  You feel sick to your stomach (nauseated) and you vomit.  You develop heavy bleeding.  You have difficulty urinating.  You start to pass your stent from your penis. Document Released: 02/12/2000 Document Revised: 12/05/2012 Document Reviewed: 08/30/2012 [facility] Patient Information 2015 [place], [place]. This information is not intended to replace advice given to you by your health care provider. Make sure you discuss any questions you have with your health care provider.  Kidney Stones Kidney stones (urolithiasis) are deposits that form inside your kidneys. The intense pain is caused by the stone moving through the urinary tract. When the stone moves, the ureter goes into spasm around the stone. The stone is usually passed in the urine.  CAUSES   A disorder that makes certain  neck glands produce too much parathyroid hormone (primary hyperparathyroidism).  A buildup of uric acid crystals, similar to gout in your joints.  Narrowing (stricture) of the ureter.  A kidney obstruction present at birth (congenital obstruction).  Previous surgery on the kidney or ureters.  Numerous kidney infections. SYMPTOMS   Feeling sick to your stomach (nauseous).  Throwing up (vomiting).  Blood in the urine (hematuria).  Pain that usually spreads (radiates) to the groin.  Frequency or urgency of urination. DIAGNOSIS   Taking a history and physical exam.  Blood or urine tests.  CT scan.  Occasionally, an examination of the inside of the urinary bladder (cystoscopy) is  performed. TREATMENT   Observation.  Increasing your fluid intake.  Extracorporeal shock wave lithotripsy--This is a noninvasive procedure that uses shock waves to break up kidney stones.  Surgery may be needed if you have severe pain or persistent obstruction. There are various surgical procedures. Most of the procedures are performed with the use of small instruments. Only small incisions are needed to accommodate these instruments, so recovery time is minimized. The size, location, and chemical composition are all important variables that will determine the proper choice of action for you. Talk to your health care provider to better understand your situation so that you will minimize the risk of injury to yourself and your kidney.  HOME CARE INSTRUCTIONS   Drink enough water and fluids to keep your urine clear or pale yellow. This will help you to pass the stone or stone fragments.  Strain all urine through the provided strainer. Keep all particulate matter and stones for your health care provider to see. The stone causing the pain may be as small as a grain of salt. It is very important to use the strainer each and every time you pass your urine. The collection of your stone will allow your health  care provider to analyze it and verify that a stone has actually passed. The stone analysis will often identify what you can do to reduce the incidence of recurrences.  Only take over-the-counter or prescription medicines for pain, discomfort, or fever as directed by your health care provider.  Make a follow-up appointment with your health care provider as directed.  Get follow-up X-rays if required. The absence of pain does not always mean that the stone has passed. It may have only stopped moving. If the urine remains completely obstructed, it can cause loss of kidney function or even complete destruction of the kidney. It is your responsibility to make sure X-rays and follow-ups are completed. Ultrasounds of the kidney can show blockages and the status of the kidney. Ultrasounds are not associated with any radiation and can be performed easily in a matter of minutes. SEEK MEDICAL CARE IF:  You experience pain that is progressive and unresponsive to any pain medicine you have been prescribed. SEEK IMMEDIATE MEDICAL CARE IF:   Pain cannot be controlled with the prescribed medicine.  You have a fever or shaking chills.  The severity or intensity of pain increases over 18 hours and is not relieved by pain medicine.  You develop a new onset of abdominal pain.  You feel faint or pass out.  You are unable to urinate. MAKE SURE YOU:   Understand these instructions.  Will watch your condition.  Will get help right away if you are not doing well or get worse. Document Released: 02/14/2005 Document Revised: 10/17/2012 Document Reviewed: 07/18/2012 [facility] Patient Information 2015 [place], [place]. This information is not intended to replace advice given to you by your health care provider. Make sure you discuss any questions you have with your health care provider.

## 2014-10-15 ENCOUNTER — Emergency Department
Admission: EM | Admit: 2014-10-15 | Discharge: 2014-10-15 | Disposition: A | Discharge status: Home / Self Care | Payer: Managed Care, Other (non HMO) | Attending: Emergency Medicine | Admitting: Emergency Medicine

## 2014-10-15 ENCOUNTER — Encounter: Payer: Self-pay | Admitting: *Deleted

## 2014-10-15 ENCOUNTER — Emergency Department: Payer: Managed Care, Other (non HMO)

## 2014-10-15 DIAGNOSIS — N189 Chronic kidney disease, unspecified: Secondary | ICD-10-CM | POA: Insufficient documentation

## 2014-10-15 DIAGNOSIS — Z79899 Other long term (current) drug therapy: Secondary | ICD-10-CM | POA: Insufficient documentation

## 2014-10-15 DIAGNOSIS — Z87442 Personal history of urinary calculi: Secondary | ICD-10-CM | POA: Insufficient documentation

## 2014-10-15 DIAGNOSIS — E876 Hypokalemia: Secondary | ICD-10-CM | POA: Insufficient documentation

## 2014-10-15 DIAGNOSIS — R109 Unspecified abdominal pain: Secondary | ICD-10-CM | POA: Diagnosis present

## 2014-10-15 DIAGNOSIS — Z87891 Personal history of nicotine dependence: Secondary | ICD-10-CM | POA: Diagnosis not present

## 2014-10-15 LAB — BASIC METABOLIC PANEL
ANION GAP: 8 (ref 5–15)
BUN: 11 mg/dL (ref 6–20)
CALCIUM: 8.5 mg/dL — AB (ref 8.9–10.3)
CHLORIDE: 104 mmol/L (ref 101–111)
CO2: 23 mmol/L (ref 22–32)
Creatinine, Ser: 1.11 mg/dL (ref 0.61–1.24)
GFR calc non Af Amer: >60 mL/min (ref >60)
Glucose, Bld: 109 mg/dL — ABNORMAL HIGH (ref 65–99)
Potassium: 2.9 mmol/L — CL (ref 3.5–5.1)
Sodium: 135 mmol/L (ref 135–145)

## 2014-10-15 LAB — URINALYSIS COMPLETE WITH MICROSCOPIC (ARMC ONLY)
Bilirubin Urine: NEGATIVE
GLUCOSE, UA: NEGATIVE mg/dL
Ketones, ur: NEGATIVE mg/dL
NITRITE: NEGATIVE
Protein, ur: 100 mg/dL — AB
Specific Gravity, Urine: 1.01 (ref 1.005–1.030)
Squamous Epithelial / LPF: NONE SEEN
pH: 6 (ref 5.0–8.0)

## 2014-10-15 LAB — CBC
HCT: 36.8 % — ABNORMAL LOW (ref 40.0–52.0)
Hemoglobin: 12.5 g/dL — ABNORMAL LOW (ref 13.0–18.0)
MCH: 31.1 pg (ref 26.0–34.0)
MCHC: 33.9 g/dL (ref 32.0–36.0)
MCV: 91.8 fL (ref 80.0–100.0)
Platelets: 265 10*3/uL (ref 150–440)
RBC: 4.01 MIL/uL — ABNORMAL LOW (ref 4.40–5.90)
RDW: 11.9 % (ref 11.5–14.5)
WBC: 8.7 10*3/uL (ref 3.8–10.6)

## 2014-10-15 MED ORDER — POTASSIUM CHLORIDE CRYS ER 20 MEQ PO TBCR
40.0000 meq | EXTENDED_RELEASE_TABLET | Freq: Once | ORAL | Status: AC
Start: 2014-10-15 — End: 2014-10-15
  Administered 2014-10-15: 40 meq via ORAL
  Filled 2014-10-15: qty 2

## 2014-10-15 MED ORDER — SODIUM CHLORIDE 0.9 % IV SOLN
Freq: Once | INTRAVENOUS | Status: AC
  Administered 2014-10-15: 18:00:00 via INTRAVENOUS

## 2014-10-15 MED ORDER — OXYCODONE-ACETAMINOPHEN 5-325 MG PO TABS
2.0000 | ORAL_TABLET | ORAL | Status: AC
  Administered 2014-10-15: 2 via ORAL
  Filled 2014-10-15: qty 2

## 2014-10-15 MED ORDER — HYDROMORPHONE HCL 1 MG/ML IJ SOLN
2.0000 mg | INTRAMUSCULAR | Status: DC
Start: 2014-10-15 — End: 2014-10-15

## 2014-10-15 MED ORDER — HYDROMORPHONE HCL 1 MG/ML IJ SOLN
INTRAMUSCULAR | Status: AC
  Administered 2014-10-15: 1 mg via INTRAVENOUS
  Filled 2014-10-15: qty 2

## 2014-10-15 MED ORDER — OXYCODONE-ACETAMINOPHEN 5-325 MG PO TABS: 1.0000 | ORAL_TABLET | Freq: Four times a day (QID) | ORAL | Status: AC | PRN

## 2014-10-15 MED ORDER — HYDROMORPHONE HCL 1 MG/ML IJ SOLN
1.0000 mg | INTRAMUSCULAR | Status: DC
  Administered 2014-10-15: 1 mg via INTRAVENOUS

## 2014-10-15 NOTE — ED Provider Notes (Signed)
[facility] Emergency Department Provider Note  ____________________________________________  Time seen: Approximately 6:57 PM  I have reviewed the triage vital signs and the nursing notes.   HISTORY  Chief Complaint Flank Pain    HPI [name] [name] is a 49 y.o. male history of nephrolithiasis. He had a recent lithotripsy, and had his ureteral stent removed this afternoon. 30 minutes after removal he develop severe left-sided flank pain which feels similar to his previous kidney stones. No nausea or vomiting, does report sweating because he is in so much pain. No shortness of breath or chest pain. No recent fevers or infections.  Pain described as sharp and severe in the area of his left flank which he describes the area of his "kidney". No groin pain or mass. No testicular pain.   Past Medical History  Diagnosis Date  . Right ureteral stone   . History of kidney stones   . Chronic kidney disease     stones  . Complication of anesthesia     waking up during procedure, lithotripsy    There are no active problems to display for this patient.   Past Surgical History  Procedure Laterality Date  . Cystoscopy w/ ureteral stent placement Right 07/06/2012    Procedure: CYSTOSCOPY WITH RETROGRADE PYELOGRAM/URETERAL STENT PLACEMENT right;  Surgeon: Antony Haste, MD;  Location: [facility];  Service: Urology;  Laterality: Right;  . Extracorporeal shock wave lithotripsy Right 07-05-2012  . Cataract extraction w/ intraocular lens implant Right   . Laser eye surgery , repair tear Bilateral 2011  . Holmium laser application Right 07/24/2012    Procedure: HOLMIUM LASER APPLICATION;  Surgeon: Antony Haste, MD;  Location: [facility];  Service: Urology;  Laterality: Right;  . Cystoscopy w/ ureteral stent removal Right 07/24/2012    Procedure: CYSTOSCOPY WITH STENT REMOVAL;  Surgeon: Antony Haste, MD;  Location: [facility];  Service: Urology;  Laterality: Right;  . Cystoscopy with stent placement Right 07/24/2012    Procedure: CYSTOSCOPY WITH STENT PLACEMENT;  Surgeon: Antony Haste, MD;  Location: [facility];  Service: Urology;  Laterality: Right;  . Cystoscopy with stent placement Left 09/23/2014    Procedure: CYSTOSCOPY WITH STENT PLACEMENT;  Surgeon: Orson Ape, MD;  Location: [facility];  Service: Urology;  Laterality: Left;    Current Outpatient Rx  Name  Route  Sig  Dispense  Refill  . EPINEPHrine (EPIPEN 2-PAK) 0.3 mg/0.3 mL DEVI   Intramuscular   Inject 0.3 mg into the muscle once. For bee stings         . hyoscyamine (LEVSIN/SL) 0.125 MG SL tablet   Sublingual   Place 1 tablet (0.125 mg total) under the tongue every 4 (four) hours as needed (bladder spasms, urinary frequency). 1-2 TABS   40 tablet   3   . levofloxacin (LEVAQUIN) 500 MG tablet   Oral   Take 1 tablet (500 mg total) by mouth daily.   7 tablet   0   . Meth-Hyo-M Bl-Na Phos-Ph Sal (URIBEL) 118 MG CAPS   Oral   Take 1 capsule (118 mg total) by mouth every 6 (six) hours as needed (dysuria).   40 capsule   3     Dispense as written.   . ondansetron (ZOFRAN ODT) 8 MG disintegrating tablet   Oral   Take 1 tablet (8 mg total) by mouth every 6 (six) hours as needed for nausea or vomiting.  10 tablet   3   . oxyCODONE-acetaminophen (ROXICET) 5-325 MG per tablet   Oral   Take 1 tablet by mouth every 6 (six) hours as needed for severe pain.   10 tablet   0   . senna (SENOKOT) 8.6 MG tablet   Oral   Take 2 tablets by mouth 2 (two) times daily.           Allergies Bee venom  History reviewed. No pertinent family history.  Social History Social History  Substance Use Topics  . Smoking status: Former Smoker -- 1.00 packs/day for 22 years    Types: Cigarettes    Quit date: 07/20/2009  . Smokeless tobacco: Never Used  . Alcohol Use: 4.2 oz/week    7 Cans of beer per week     Review of Systems Constitutional: No fever/chills Eyes: No visual changes. ENT: No sore throat. Cardiovascular: Denies chest pain. Respiratory: Denies shortness of breath. Gastrointestinal: No abdominal pain but pain in the left flank.  He was nauseated. no vomiting.  No diarrhea.  No constipation. Genitourinary: Negative for dysuria. Musculoskeletal: Negative for back pain. Skin: Negative for rash. Neurological: Negative for headaches, focal weakness or numbness.  10-point ROS otherwise negative.  ____________________________________________   PHYSICAL EXAM:  VITAL SIGNS: ED Triage Vitals  Enc Vitals Group     BP 10/15/14 1757 137/92 mmHg     Pulse Rate 10/15/14 1757 74     Resp 10/15/14 1757 16     Temp 10/15/14 1726 97.8 F (36.6 C)     Temp Source 10/15/14 1726 Oral     SpO2 10/15/14 1726 98 %     Weight 10/15/14 1726 235 lb (106.595 kg)     Height 10/15/14 1726  (1.88 m)     Head Cir --      Peak Flow --      Pain Score 10/15/14 1735 10     Pain Loc --      Pain Edu? --      Excl. in GC? --     Constitutional: Alert and oriented. Patient appears in pain, unable to comfortable holding his left flank. Eyes: Conjunctivae are normal. PERRL. EOMI. Head: Atraumatic. Nose: No congestion/rhinnorhea. Mouth/Throat: Mucous membranes are moist.  Oropharynx non-erythematous. Neck: No stridor.   Cardiovascular: Normal rate, regular rhythm. Grossly normal heart sounds.  Good peripheral circulation. Respiratory: Normal respiratory effort.  No retractions. Lungs CTAB. Gastrointestinal: Soft and nontender. No distention. No abdominal bruits. Moderate left-sided CVA tenderness. No scrotal pain or edema. No groin pain or mass. Musculoskeletal: No lower extremity tenderness nor edema.  No joint effusions. Neurologic:  Normal speech and language. No gross focal neurologic deficits are appreciated. No gait instability. Skin:  Skin is warm, dry and intact. No rash  noted. Psychiatric: Mood and affect are normal. Speech and behavior are normal.  ____________________________________________   LABS (all labs ordered are listed, but only abnormal results are displayed)  Labs Reviewed  BASIC METABOLIC PANEL - Abnormal; Notable for the following:    Potassium 2.9 (*)    Glucose, Bld 109 (*)    Calcium 8.5 (*)    All other components within normal limits  URINALYSIS COMPLETEWITH MICROSCOPIC ([facility] ONLY) - Abnormal; Notable for the following:    Color, Urine BLUE (*)    APPearance CLEAR (*)    Hgb urine dipstick 2+ (*)    Protein, ur 100 (*)    Leukocytes, UA 2+ (*)    Bacteria, UA RARE (*)  All other components within normal limits  CBC - Abnormal; Notable for the following:    RBC 4.01 (*)    Hemoglobin 12.5 (*)    HCT 36.8 (*)    All other components within normal limits  URINE CULTURE   ____________________________________________  EKG   ____________________________________________  RADIOLOGY  DG Abd 1 View (Final result) Result time: 10/15/14 18:32:07   Final result by Rad Results In Interface (10/15/14 18:32:07)   Narrative:   CLINICAL DATA: Left side abdominal pain. History of urinary tract stones.  EXAM: ABDOMEN - 1 VIEW  COMPARISON: KUB 07/17/2012. CT abdomen and pelvis 07/05/2012.  FINDINGS: No evidence of urinary tract stone is identified. The bowel gas pattern is normal. No bony abnormality is seen.  IMPRESSION: Negative exam.    ____________________________________________   PROCEDURES  Procedure(s) performed: None  Critical Care performed: No  ____________________________________________   INITIAL IMPRESSION / ASSESSMENT AND PLAN / ED COURSE  Pertinent labs & imaging results that were available during my care of the patient were reviewed by me and considered in my medical decision making (see chart for details).  Called and discussed with Dr. Sheppard Penton. He advises that this is consistent with  ureteral spasm after stent removal. He advises that he would recommend providing pain control and discharged thereafter. Dr. Sheppard Penton following closely in the clinic.  Though I do believe this is likely due to ureteral spasm, in the setting of recent lithotripsy we'll obtain a KUB and basic labs including metabolic panel to evaluate creatinine.  ----------------------------------------- 6:59 PM on 10/15/2014 -----------------------------------------  Patient reports his pain is improved significantly, he is resting more comfortably but still having moderate left-sided pain. KUB does not demonstrate a kidney stone, likely was broken up and may have passed some of the smaller stones after lithotripsy and stent.  ----------------------------------------- 7:40 PM on 10/15/2014 -----------------------------------------  Patient reports his pain is improved. Currently a 3 out of 10, but feeling some discomfort returning. I'll give the patient Percocet for discomfort and will continue to monitor him. Await urinalysis.  ----------------------------------------- 9:01 PM on 10/15/2014 -----------------------------------------  Reviewed labs, clinical symptoms, and urinalysis results with Dr. Sheppard Penton. He advises no antibiotic's, and discharge the patient with pain control and follow-up with him in the clinic this week. Patient has no systemic symptoms of illness, he is afebrile, with normal white count and Dr. Sheppard Penton advises that this is a very normal urinalysis for a patient who just had a ureteral stent removed. We will send a urine culture, and advised the patient on return precautions and follow-up with Dr. Sheppard Penton. ____________________________________________   FINAL CLINICAL IMPRESSION(S) / ED DIAGNOSES  Final diagnoses:  Hypokalemia  Left flank pain      Sharyn Creamer, MD 10/15/14 2102

## 2014-10-15 NOTE — Discharge Instructions (Signed)
You have been seen in the Emergency Department (ED) today for pain that we believe based on your workup, is caused by spasm of your ureter.  As we have discussed, please drink plenty of fluids.  Please make a follow up appointment with the physician(s) listed elsewhere in this documentation.  You may take pain medication as needed but ONLY as prescribed.  Please also take your prescribed Flomax daily.  We also recommend that you take over-the-counter ibuprofen regularly according to label instructions over the next 5 days.  Take it with meals to minimize stomach discomfort.  Please see your doctor as soon as possible this week.  Do not drink alcohol, drive or participate in any other potentially dangerous activities while taking opiate pain medication as it may make you sleepy. Do not take this medication with any other sedating medications, either prescription or over-the-counter. If you were prescribed Percocet or Vicodin, do not take these with acetaminophen (Tylenol) as it is already contained within these medications.   This medication is an opiate (or narcotic) pain medication and can be habit forming.  Use it as little as possible to achieve adequate pain control.  Do not use or use it with extreme caution if you have a history of opiate abuse or dependence.  If you are on a pain contract with your primary care doctor or a pain specialist, be sure to let them know you were prescribed this medication today from the [facility].  This medication is intended for your use only - do not give any to anyone else and keep it in a secure place where nobody else, especially children, have access to it.  It will also cause or worsen constipation, so you may want to consider taking an over-the-counter stool softener while you are taking this medication.  Return to the Emergency Department (ED) or call your doctor if you have any worsening pain, fever, painful urination, are unable to  urinate, or develop other symptoms that concern you.

## 2014-10-17 LAB — URINE CULTURE
Culture: NO GROWTH
Special Requests: NORMAL

## 2014-11-07 ENCOUNTER — Encounter: Payer: Self-pay | Admitting: Urology

## 2019-01-14 ENCOUNTER — Other Ambulatory Visit: Payer: Self-pay

## 2019-01-14 DIAGNOSIS — Z20822 Contact with and (suspected) exposure to covid-19: Secondary | ICD-10-CM

## 2019-01-16 LAB — NOVEL CORONAVIRUS, NAA: SARS-CoV-2, NAA: NOT DETECTED

## 2019-04-29 ENCOUNTER — Ambulatory Visit: Payer: Managed Care, Other (non HMO) | Attending: Internal Medicine

## 2019-04-29 DIAGNOSIS — U071 COVID-19: Secondary | ICD-10-CM

## 2019-05-01 ENCOUNTER — Telehealth: Payer: Self-pay

## 2019-05-01 LAB — NOVEL CORONAVIRUS, NAA: SARS-CoV-2, NAA: NOT DETECTED

## 2019-05-01 NOTE — Telephone Encounter (Signed)
Negative COVID results given. Patient results "NOT Detected." Caller expressed understanding. ° °

## 2019-05-03 ENCOUNTER — Ambulatory Visit: Payer: Managed Care, Other (non HMO) | Attending: Internal Medicine

## 2019-05-03 DIAGNOSIS — Z20822 Contact with and (suspected) exposure to covid-19: Secondary | ICD-10-CM

## 2019-05-04 LAB — SPECIMEN STATUS REPORT

## 2019-05-04 LAB — NOVEL CORONAVIRUS, NAA: SARS-CoV-2, NAA: NOT DETECTED

## 2019-05-23 ENCOUNTER — Ambulatory Visit: Payer: Managed Care, Other (non HMO) | Attending: Internal Medicine

## 2019-05-23 DIAGNOSIS — Z20822 Contact with and (suspected) exposure to covid-19: Secondary | ICD-10-CM

## 2019-05-24 LAB — SARS-COV-2, NAA 2 DAY TAT

## 2019-05-24 LAB — NOVEL CORONAVIRUS, NAA: SARS-CoV-2, NAA: NOT DETECTED

## 2020-02-03 ENCOUNTER — Other Ambulatory Visit: Payer: Self-pay

## 2020-02-03 DIAGNOSIS — Z20822 Contact with and (suspected) exposure to covid-19: Secondary | ICD-10-CM

## 2020-02-04 LAB — SARS-COV-2, NAA 2 DAY TAT

## 2020-02-04 LAB — NOVEL CORONAVIRUS, NAA: SARS-CoV-2, NAA: NOT DETECTED

## 2022-06-22 ENCOUNTER — Encounter: Payer: Self-pay | Admitting: Emergency Medicine

## 2022-06-22 ENCOUNTER — Ambulatory Visit
Admission: EM | Admit: 2022-06-22 | Discharge: 2022-06-22 | Disposition: A | Discharge status: Home / Self Care | Payer: BC Managed Care – PPO | Attending: Emergency Medicine | Admitting: Emergency Medicine

## 2022-06-22 DIAGNOSIS — R030 Elevated blood-pressure reading, without diagnosis of hypertension: Secondary | ICD-10-CM

## 2022-06-22 DIAGNOSIS — H6693 Otitis media, unspecified, bilateral: Secondary | ICD-10-CM | POA: Diagnosis not present

## 2022-06-22 MED ORDER — AMOXICILLIN 875 MG PO TABS: 875.0000 mg | ORAL_TABLET | Freq: Two times a day (BID) | ORAL | 0 refills | Status: AC

## 2022-06-22 NOTE — ED Provider Notes (Signed)
[name] [name]    CSN: [id number] Arrival date & time: 06/22/22  1625      History   Chief Complaint No chief complaint on file.   HPI LEOVARDO THOMAN is a 57 y.o. male.  Patient presents with bilateral ear pain x 1 day.  He also reports congestion, sore throat, and cough.  No fever, rash, ear drainage, sore throat, shortness of breath, chest pain, or other symptoms.  No OTC medications today.  His medical history includes kidney stones.  The history is provided by the patient and medical records.    Past Medical History:  Diagnosis Date   Chronic kidney disease    stones   Complication of anesthesia    waking up during procedure, lithotripsy   History of kidney stones    Right ureteral stone     There are no problems to display for this patient.   Past Surgical History:  Procedure Laterality Date   CATARACT EXTRACTION W/ INTRAOCULAR LENS IMPLANT Right    CYSTOSCOPY W/ URETERAL STENT PLACEMENT Right 07/06/2012   Procedure: CYSTOSCOPY WITH RETROGRADE PYELOGRAM/URETERAL STENT PLACEMENT right;  Surgeon: Antony Haste, MD;  Location: [facility];  Service: Urology;  Laterality: Right;   CYSTOSCOPY W/ URETERAL STENT REMOVAL Right 07/24/2012   Procedure: CYSTOSCOPY WITH STENT REMOVAL;  Surgeon: Antony Haste, MD;  Location[facility];  Service: Urology;  Laterality: Right;   CYSTOSCOPY WITH STENT PLACEMENT Right 07/24/2012   Procedure: CYSTOSCOPY WITH STENT PLACEMENT;  Surgeon: Antony Haste, MD;  Location: [facility];  Service: Urology;  Laterality: Right;   CYSTOSCOPY WITH STENT PLACEMENT Left 09/23/2014   Procedure: CYSTOSCOPY WITH STENT PLACEMENT;  Surgeon: Orson Ape, MD;  Location: [facility];  Service: Urology;  Laterality: Left;   EXTRACORPOREAL SHOCK WAVE LITHOTRIPSY Right 07-05-2012   EXTRACORPOREAL SHOCK WAVE LITHOTRIPSY Left 09/25/2014   Procedure: EXTRACORPOREAL SHOCK WAVE LITHOTRIPSY (ESWL);  Surgeon:  Orson Ape, MD;  Location: [facility];  Service: Urology;  Laterality: Left;   HOLMIUM LASER APPLICATION Right 07/24/2012   Procedure: HOLMIUM LASER APPLICATION;  Surgeon: Antony Haste, MD;  Location: [facility];  Service: Urology;  Laterality: Right;   LASER EYE SURGERY , REPAIR TEAR Bilateral 2011       Home Medications    Prior to Admission medications   Medication Sig Start Date End Date Taking? Authorizing Provider  amoxicillin (AMOXIL) 875 MG tablet Take 1 tablet (875 mg total) by mouth 2 (two) times daily for 10 days. 06/22/22 07/02/22 Yes Mickie Bail, NP  EPINEPHrine (EPIPEN 2-PAK) 0.3 mg/0.3 mL DEVI Inject 0.3 mg into the muscle once. For bee stings    [provider]  hyoscyamine (LEVSIN/SL) 0.125 MG SL tablet Place 1 tablet (0.125 mg total) under the tongue every 4 (four) hours as needed (bladder spasms, urinary frequency). 1-2 TABS 09/23/14   Orson Ape, MD  levofloxacin (LEVAQUIN) 500 MG tablet Take 1 tablet (500 mg total) by mouth daily. 09/23/14   Orson Ape, MD  Meth-Hyo-M Salley Hews Phos-Ph Sal (URIBEL) 118 MG CAPS Take 1 capsule (118 mg total) by mouth every 6 (six) hours as needed (dysuria). 09/23/14   Orson Ape, MD  ondansetron (ZOFRAN ODT) 8 MG disintegrating tablet Take 1 tablet (8 mg total) by mouth every 6 (six) hours as needed for nausea or vomiting. 09/25/14   Orson Ape, MD  oxyCODONE-acetaminophen (ROXICET) 5-325 MG per tablet Take 1 tablet by mouth  every 6 (six) hours as needed for severe pain. 10/15/14   Sharyn Creamer, MD  senna (SENOKOT) 8.6 MG tablet Take 2 tablets by mouth 2 (two) times daily.    [provider]    Family History History reviewed. No pertinent family history.  Social History Social History   Tobacco Use   Smoking status: Former    Packs/day: 1.00    Years: 22.00    Additional pack years: 0.00    Total pack years: 22.00    Types: Cigarettes    Quit date: 07/20/2009    Years  since quitting: 12.9   Smokeless tobacco: Never  Substance Use Topics   Alcohol use: Yes    Alcohol/week: 7.0 standard drinks of alcohol    Types: 7 Cans of beer per week   Drug use: No    Comment: 2013     Allergies   Bee venom   Review of Systems Review of Systems  Constitutional:  Negative for chills and fever.  HENT:  Positive for congestion, ear pain and sore throat. Negative for ear discharge.   Respiratory:  Positive for cough. Negative for shortness of breath.   Cardiovascular:  Negative for chest pain and palpitations.  Gastrointestinal:  Negative for diarrhea and vomiting.  Skin:  Negative for rash.     Physical Exam Triage Vital Signs ED Triage Vitals [06/22/22 1634]  Enc Vitals Group     BP (!) 145/99     Pulse Rate 100     Resp 18     Temp 98.1 F (36.7 C)     Temp src      SpO2 95 %     Weight      Height      Head Circumference      Peak Flow      Pain Score      Pain Loc      Pain Edu?      Excl. in GC?    No data found.  Updated Vital Signs BP 126/81   Pulse 100   Temp 98.1 F (36.7 C)   Resp 18   SpO2 95%   Visual Acuity Right Eye Distance:   Left Eye Distance:   Bilateral Distance:    Right Eye Near:   Left Eye Near:    Bilateral Near:     Physical Exam Vitals and nursing note reviewed.  Constitutional:      General: He is not in acute distress.    Appearance: Normal appearance. He is well-developed. He is not ill-appearing.  HENT:     Right Ear: Tympanic membrane is erythematous.     Left Ear: Tympanic membrane is erythematous.     Nose: Nose normal.     Mouth/Throat:     Mouth: Mucous membranes are moist.     Pharynx: Oropharynx is clear.  Eyes:     Conjunctiva/sclera: Conjunctivae normal.  Cardiovascular:     Rate and Rhythm: Normal rate and regular rhythm.     Heart sounds: Normal heart sounds.  Pulmonary:     Effort: Pulmonary effort is normal. No respiratory distress.     Breath sounds: Normal breath sounds.   Musculoskeletal:     Cervical back: Neck supple.  Skin:    General: Skin is warm and dry.  Neurological:     Mental Status: He is alert.  Psychiatric:        Mood and Affect: Mood normal.        Behavior:  Behavior normal.      UC Treatments / Results  Labs (all labs ordered are listed, but only abnormal results are displayed) Labs Reviewed - No data to display  EKG   Radiology No results found.  Procedures Procedures (including critical care time)  Medications Ordered in UC Medications - No data to display  Initial Impression / Assessment and Plan / UC Course  I have reviewed the triage vital signs and the nursing notes.  Pertinent labs & imaging results that were available during my care of the patient were reviewed by me and considered in my medical decision making (see chart for details).    Bilateral otitis media.  Elevated blood pressure reading.  Treating ear infection with amoxicillin.  Tylenol or ibuprofen as needed for fever or discomfort.  Instructed patient to follow-up with his PCP if his symptoms are not improving.  Education provided on otitis media.  Also discussed that his blood pressure reading was elevated today and needs to be rechecked by his PCP.  Education provided on preventing hypertension.  Patient agrees to plan of care.  Final Clinical Impressions(s) / UC Diagnoses   Final diagnoses:  Bilateral otitis media, unspecified otitis media type  Elevated blood pressure reading     Discharge Instructions      Take the amoxicillin as directed.  Follow up with your primary care provider if your symptoms are not improving.    Your blood pressure is elevated today at 145/99; recheck 126/81.  Please have this rechecked by your primary care provider in 2-4 weeks.          ED Prescriptions     Medication Sig Dispense Auth. Provider   amoxicillin (AMOXIL) 875 MG tablet Take 1 tablet (875 mg total) by mouth 2 (two) times daily for 10 days. 20  tablet Mickie Bail, NP      PDMP not reviewed this encounter.   Mickie Bail, NP 06/22/22 [phone]

## 2022-06-22 NOTE — Discharge Instructions (Addendum)
Take the amoxicillin as directed.  Follow up with your primary care provider if your symptoms are not improving.    Your blood pressure is elevated today at 145/99; recheck 126/81.  Please have this rechecked by your primary care provider in 2-4 weeks.

## 2022-07-04 ENCOUNTER — Ambulatory Visit
Admission: EM | Admit: 2022-07-04 | Discharge: 2022-07-04 | Disposition: A | Discharge status: Home / Self Care | Payer: BC Managed Care – PPO | Attending: Urgent Care | Admitting: Urgent Care

## 2022-07-04 DIAGNOSIS — H6992 Unspecified Eustachian tube disorder, left ear: Secondary | ICD-10-CM

## 2022-07-04 MED ORDER — PREDNISONE 50 MG PO TABS: 50.0000 mg | ORAL_TABLET | Freq: Every day | ORAL | 0 refills | Status: AC

## 2022-07-04 NOTE — ED Triage Notes (Signed)
Patient presents to UC for left ear pain x 10 days ago. He states he was seen at [facility] about 2 weeks ago. Was prescribed antibiotics that did not help.

## 2022-07-04 NOTE — Discharge Instructions (Addendum)
Follow up here or with your primary care provider if your symptoms are worsening or not improving.     

## 2022-07-04 NOTE — ED Provider Notes (Signed)
[name] [name]    CSN: [id number] Arrival date & time: 07/04/22  1805      History   Chief Complaint Chief Complaint  Patient presents with   Otalgia    HPI [name] [name] is a 57 y.o. male.    Otalgia   Patient presents to urgent care with complaint of left ear pain x 10 days.  He was seen in urgent care about 2 weeks ago and prescribed antibiotics that did not relieve his symptoms.  Patient was seen on 06/22/2022 and treated for bilateral otitis media with amoxicillin.  Past Medical History:  Diagnosis Date   Chronic kidney disease    stones   Complication of anesthesia    waking up during procedure, lithotripsy   History of kidney stones    Right ureteral stone     There are no problems to display for this patient.   Past Surgical History:  Procedure Laterality Date   CATARACT EXTRACTION W/ INTRAOCULAR LENS IMPLANT Right    CYSTOSCOPY W/ URETERAL STENT PLACEMENT Right 07/06/2012   Procedure: CYSTOSCOPY WITH RETROGRADE PYELOGRAM/URETERAL STENT PLACEMENT right;  Surgeon: Antony Haste, MD;  Location: [facility];  Service: Urology;  Laterality: Right;   CYSTOSCOPY W/ URETERAL STENT REMOVAL Right 07/24/2012   Procedure: CYSTOSCOPY WITH STENT REMOVAL;  Surgeon: Antony Haste, MD;  Location: [facility];  Service: Urology;  Laterality: Right;   CYSTOSCOPY WITH STENT PLACEMENT Right 07/24/2012   Procedure: CYSTOSCOPY WITH STENT PLACEMENT;  Surgeon: Antony Haste, MD;  Location: [facility];  Service: Urology;  Laterality: Right;   CYSTOSCOPY WITH STENT PLACEMENT Left 09/23/2014   Procedure: CYSTOSCOPY WITH STENT PLACEMENT;  Surgeon: Orson Ape, MD;  Location: [facility];  Service: Urology;  Laterality: Left;   EXTRACORPOREAL SHOCK WAVE LITHOTRIPSY Right 07-05-2012   EXTRACORPOREAL SHOCK WAVE LITHOTRIPSY Left 09/25/2014   Procedure: EXTRACORPOREAL SHOCK WAVE LITHOTRIPSY (ESWL);  Surgeon: Orson Ape, MD;   Location: [facility];  Service: Urology;  Laterality: Left;   HOLMIUM LASER APPLICATION Right 07/24/2012   Procedure: HOLMIUM LASER APPLICATION;  Surgeon: Antony Haste, MD;  Location: [facility];  Service: Urology;  Laterality: Right;   LASER EYE SURGERY , REPAIR TEAR Bilateral 2011       Home Medications    Prior to Admission medications   Medication Sig Start Date End Date Taking? Authorizing Provider  EPINEPHrine (EPIPEN 2-PAK) 0.3 mg/0.3 mL DEVI Inject 0.3 mg into the muscle once. For bee stings    [provider]  hyoscyamine (LEVSIN/SL) 0.125 MG SL tablet Place 1 tablet (0.125 mg total) under the tongue every 4 (four) hours as needed (bladder spasms, urinary frequency). 1-2 TABS 09/23/14   Orson Ape, MD  levofloxacin (LEVAQUIN) 500 MG tablet Take 1 tablet (500 mg total) by mouth daily. 09/23/14   Orson Ape, MD  Meth-Hyo-M Salley Hews Phos-Ph Sal (URIBEL) 118 MG CAPS Take 1 capsule (118 mg total) by mouth every 6 (six) hours as needed (dysuria). 09/23/14   Orson Ape, MD  ondansetron (ZOFRAN ODT) 8 MG disintegrating tablet Take 1 tablet (8 mg total) by mouth every 6 (six) hours as needed for nausea or vomiting. 09/25/14   Orson Ape, MD  oxyCODONE-acetaminophen (ROXICET) 5-325 MG per tablet Take 1 tablet by mouth every 6 (six) hours as needed for severe pain. 10/15/14   Sharyn Creamer, MD  senna (SENOKOT) 8.6 MG tablet Take 2 tablets by mouth 2 (two) times  daily.    [provider]    Family History History reviewed. No pertinent family history.  Social History Social History   Tobacco Use   Smoking status: Former    Packs/day: 1.00    Years: 22.00    Additional pack years: 0.00    Total pack years: 22.00    Types: Cigarettes    Quit date: 07/20/2009    Years since quitting: 12.9   Smokeless tobacco: Never  Substance Use Topics   Alcohol use: Yes    Alcohol/week: 7.0 standard drinks of alcohol    Types: 7 Cans of beer  per week   Drug use: No    Comment: 2013     Allergies   Bee venom   Review of Systems Review of Systems  HENT:  Positive for ear pain.      Physical Exam Triage Vital Signs ED Triage Vitals [07/04/22 1850]  Enc Vitals Group     BP      Pulse      Resp      Temp      Temp src      SpO2      Weight      Height      Head Circumference      Peak Flow      Pain Score 0     Pain Loc      Pain Edu?      Excl. in GC?    No data found.  Updated Vital Signs There were no vitals taken for this visit.  Visual Acuity Right Eye Distance:   Left Eye Distance:   Bilateral Distance:    Right Eye Near:   Left Eye Near:    Bilateral Near:     Physical Exam Vitals reviewed.  Constitutional:      Appearance: Normal appearance. He is ill-appearing.  HENT:     Right Ear: Tympanic membrane normal.     Left Ear: Tympanic membrane normal.  Skin:    General: Skin is warm and dry.  Neurological:     General: No focal deficit present.     Mental Status: He is alert and oriented to person, place, and time.  Psychiatric:        Mood and Affect: Mood normal.        Behavior: Behavior normal.      UC Treatments / Results  Labs (all labs ordered are listed, but only abnormal results are displayed) Labs Reviewed - No data to display  EKG   Radiology No results found.  Procedures Procedures (including critical care time)  Medications Ordered in UC Medications - No data to display  Initial Impression / Assessment and Plan / UC Course  I have reviewed the triage vital signs and the nursing notes.  Pertinent labs & imaging results that were available during my care of the patient were reviewed by me and considered in my medical decision making (see chart for details).   [name] [name] is a 57 y.o. male presenting with L otalgia/pressure. Patient is afebrile without recent antipyretics, satting well on room air. Overall is ill appearing though non-toxic, well  hydrated, without respiratory distress.  TMs are WNL bilaterally.  Possibly some left middle ear effusion is present.    Suspect eustachian tube dysfunction on the left secondary to unrelieved sinusitis.  Offered prescription of anti-inflammatory prednisone to patient which he excepted.  Counseled patient on potential for adverse effects with medications prescribed/recommended today, ER  and return-to-clinic precautions discussed, patient verbalized understanding and agreement with care plan.   Final Clinical Impressions(s) / UC Diagnoses   Final diagnoses:  None   Discharge Instructions   None    ED Prescriptions   None    PDMP not reviewed this encounter.   Charma Igo, [place] 07/04/22 1858
